# Patient Record
Sex: Male | Born: 1970 | Race: White | Hispanic: No | Marital: Married | Smoking: Current every day smoker
Health system: Southern US, Community
[De-identification: ages and names within clinical notes are randomized; demographics above are authoritative.]

## PROBLEM LIST (undated history)

## (undated) DIAGNOSIS — T7840XA Allergy, unspecified, initial encounter: Secondary | ICD-10-CM

## (undated) DIAGNOSIS — I10 Essential (primary) hypertension: Secondary | ICD-10-CM

## (undated) DIAGNOSIS — R011 Cardiac murmur, unspecified: Secondary | ICD-10-CM

## (undated) HISTORY — DX: Allergy, unspecified, initial encounter: T78.40XA

## (undated) HISTORY — DX: Essential (primary) hypertension: I10

## (undated) HISTORY — DX: Cardiac murmur, unspecified: R01.1

---

## 1998-03-14 HISTORY — PX: OTHER SURGICAL HISTORY: SHX169

## 2012-09-25 LAB — LIPID PANEL
CHOLESTEROL: 219 mg/dL — AB (ref 0–200)
HDL: 38 mg/dL (ref 35–70)
LDL Cholesterol: 140 mg/dL
TRIGLYCERIDES: 204 mg/dL — AB (ref 40–160)

## 2013-12-04 ENCOUNTER — Ambulatory Visit: Payer: Self-pay | Admitting: Internal Medicine

## 2013-12-04 LAB — BASIC METABOLIC PANEL WITH GFR
BUN: 14 mg/dL (ref 4–21)
Creatinine: 1 mg/dL (ref <=1.3)

## 2013-12-04 LAB — CBC AND DIFFERENTIAL: Hemoglobin: 15.7 g/dL (ref 13.5–17.5)

## 2013-12-04 LAB — TSH: TSH: 1.26 u[IU]/mL (ref <=5.90)

## 2015-01-24 ENCOUNTER — Encounter: Payer: Self-pay | Admitting: Internal Medicine

## 2015-01-28 ENCOUNTER — Other Ambulatory Visit: Payer: Self-pay | Admitting: Internal Medicine

## 2015-01-28 DIAGNOSIS — I10 Essential (primary) hypertension: Secondary | ICD-10-CM | POA: Insufficient documentation

## 2015-01-28 DIAGNOSIS — F17200 Nicotine dependence, unspecified, uncomplicated: Secondary | ICD-10-CM | POA: Insufficient documentation

## 2015-01-28 DIAGNOSIS — E785 Hyperlipidemia, unspecified: Secondary | ICD-10-CM | POA: Insufficient documentation

## 2015-01-28 DIAGNOSIS — R0683 Snoring: Secondary | ICD-10-CM | POA: Insufficient documentation

## 2015-01-28 DIAGNOSIS — R519 Headache, unspecified: Secondary | ICD-10-CM | POA: Insufficient documentation

## 2015-01-28 DIAGNOSIS — E782 Mixed hyperlipidemia: Secondary | ICD-10-CM | POA: Insufficient documentation

## 2015-01-28 DIAGNOSIS — G8929 Other chronic pain: Secondary | ICD-10-CM | POA: Insufficient documentation

## 2015-01-28 DIAGNOSIS — R51 Headache: Secondary | ICD-10-CM

## 2015-07-01 ENCOUNTER — Ambulatory Visit: Payer: Self-pay | Admitting: Internal Medicine

## 2015-07-03 ENCOUNTER — Encounter: Payer: Self-pay | Admitting: Internal Medicine

## 2015-07-03 ENCOUNTER — Ambulatory Visit (INDEPENDENT_AMBULATORY_CARE_PROVIDER_SITE_OTHER): Payer: Self-pay | Admitting: Internal Medicine

## 2015-07-03 VITALS — BP 133/76 | HR 80 | Ht 72.0 in | Wt 227.0 lb

## 2015-07-03 DIAGNOSIS — F419 Anxiety disorder, unspecified: Secondary | ICD-10-CM | POA: Insufficient documentation

## 2015-07-03 DIAGNOSIS — R229 Localized swelling, mass and lump, unspecified: Secondary | ICD-10-CM

## 2015-07-03 DIAGNOSIS — R220 Localized swelling, mass and lump, head: Secondary | ICD-10-CM

## 2015-07-03 DIAGNOSIS — I10 Essential (primary) hypertension: Secondary | ICD-10-CM

## 2015-07-03 DIAGNOSIS — F411 Generalized anxiety disorder: Secondary | ICD-10-CM | POA: Insufficient documentation

## 2015-07-03 MED ORDER — ALPRAZOLAM 0.5 MG PO TABS: 0.5000 mg | ORAL_TABLET | Freq: Every evening | ORAL | Status: DC | PRN

## 2015-07-03 MED ORDER — HYDROCHLOROTHIAZIDE 25 MG PO TABS: 25.0000 mg | ORAL_TABLET | Freq: Every day | ORAL | Status: DC

## 2015-07-03 MED ORDER — AMLODIPINE BESYLATE 5 MG PO TABS: 5.0000 mg | ORAL_TABLET | Freq: Every day | ORAL | Status: DC

## 2015-07-03 MED ORDER — BUPROPION HCL ER (XL) 150 MG PO TB24: 150.0000 mg | ORAL_TABLET | Freq: Every day | ORAL | Status: DC

## 2015-07-03 NOTE — Progress Notes (Signed)
Date:  07/03/2015   Name:  Maurice Hill   DOB:  27-Aug-1970   MRN:  [id number]   Chief Complaint: Follow-up; Anxiety; and Hypertension Anxiety Presents for initial visit. The problem has been gradually worsening. Symptoms include irritability, nervous/anxious behavior and restlessness. Patient reports no chest pain, decreased concentration, dizziness, excessive worry, palpitations, shortness of breath or suicidal ideas. Symptoms occur most days. The severity of symptoms is causing significant distress. 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   Past treatments include SSRIs (he has taken celexa in the past and it removed all emotion.  He is very hesitant to try again.Marland Kitchen.  He took several of his wife's xanax and it help significantly to calm him down.).  Hypertension This is a chronic problem. The current episode started more than 1 year ago. The problem is unchanged. The problem is controlled. Associated symptoms include anxiety. Pertinent negatives include no chest pain, palpitations or shortness of breath.   Erectile dysfunction - he had normal libido but decreased strength of erections.  He is able to achieve intercourse.  He has never tried Viagra and can not afford it.  I do not believe that he needs any intervention at this time.  Issues may also be tied to recent anxiety and work stressors.  Lesion - behind left ear - cystic lesion that began several years ago.  Initially small, now larger.  It has not drained.  Recently it seemed to get bigger but now in decreasing in size.  It is not tender. Review of Systems  Constitutional: Positive for irritability. Negative for fever, chills and fatigue.  Respiratory: Negative for choking, chest tightness, shortness of breath and wheezing.   Cardiovascular: Negative for chest pain, palpitations and leg swelling.  Gastrointestinal: Negative for  abdominal pain.  Genitourinary:       Mild decrease in the strength of his erections.   Musculoskeletal: Negative for joint swelling and arthralgias.  Skin: Positive for wound. Negative for color change.  Neurological: Negative for dizziness and numbness.  Psychiatric/Behavioral: Positive for sleep disturbance and agitation. Negative for suicidal ideas, self-injury, dysphoric mood and decreased concentration. The patient is nervous/anxious.     Patient Active Problem List   Diagnosis Date Noted  . Essential (primary) hypertension 01/28/2015  . Chronic headache 01/28/2015  . HLD (hyperlipidemia) 01/28/2015  . Snores 01/28/2015  . Compulsive tobacco user syndrome 01/28/2015    Prior to Admission medications   Medication Sig Start Date End Date Taking? Authorizing Provider  amLODipine (NORVASC) 5 MG tablet Take 1 tablet by mouth daily. 07/03/14  Yes Historical Provider, MD  hydrochlorothiazide (HYDRODIURIL) 25 MG tablet Take 1 tablet by mouth daily. 07/03/14  Yes Historical Provider, MD    Allergies  Allergen Reactions  . Penicillins Rash    Past Surgical History  Procedure Laterality Date  . Testicular cyst  2000    Social History  Substance Use Topics  . Smoking status: Current Every Day Smoker  . Smokeless tobacco: None  . Alcohol Use: 0.0 oz/week    0 Standard drinks or equivalent per week     Comment: occasional     Medication list has been reviewed and updated.   Physical Exam  Constitutional: He is oriented to person, place, and time. He appears well-developed and well-nourished. No distress.  HENT:  Head: Normocephalic and atraumatic.  Neck: Normal range of motion. Neck supple. No thyromegaly present.  Cardiovascular: Normal rate, regular rhythm and normal heart sounds.   Pulmonary/Chest: Effort normal and breath sounds normal. No respiratory distress.  Musculoskeletal: He exhibits no edema or tenderness.  Lymphadenopathy:    He has no cervical adenopathy.    Neurological: He is alert and oriented to person, place, and time.  Skin: Skin is warm and dry. No rash noted.  Behind left ear - 1x1 cm very soft cystic structure - not tender or inflamed.  Psychiatric: His speech is normal and behavior is normal. Thought content normal. His mood appears anxious.    BP 140/82 mmHg  Pulse 80  Ht 6' (1.829 m)  Wt 227 lb (102.967 kg)  BMI 30.78 kg/m2  Assessment and Plan: 1. Essential (primary) hypertension Controlled - continue current therapy - hydrochlorothiazide (HYDRODIURIL) 25 MG tablet; Take 1 tablet (25 mg total) by mouth daily.  Dispense: 90 tablet; Refill: 3 - amLODipine (NORVASC) 5 MG tablet; Take 1 tablet (5 mg total) by mouth daily.  Dispense: 90 tablet; Refill: 3 - Basic metabolic panel  2. Generalized anxiety disorder Begin Wellbutrin - call for refill if helpful Pt instructed to use Xanax no more than twice per week Re-enforced regular exercise as a beneficial activity. - buPROPion (WELLBUTRIN XL) 150 MG 24 hr tablet; Take 1 tablet (150 mg total) by mouth daily.  Dispense: 30 tablet; Refill: 3 - ALPRAZolam (XANAX) 0.5 MG tablet; Take 1 tablet (0.5 mg total) by mouth at bedtime as needed for anxiety.  Dispense: 20 tablet; Refill: 0  3. Mass of scalp Recommend Dermatology evaluation   Bari Edward, MD [facility]  07/03/2015

## 2015-07-03 NOTE — Patient Instructions (Signed)
DASH Eating Plan  DASH stands for "Dietary Approaches to Stop Hypertension." The DASH eating plan is a healthy eating plan that has been shown to reduce high blood pressure (hypertension). Additional health benefits may include reducing the risk of type 2 diabetes mellitus, heart disease, and stroke. The DASH eating plan may also help with weight loss.  WHAT DO I NEED TO KNOW ABOUT THE DASH EATING PLAN?  For the DASH eating plan, you will follow these general guidelines:  · Choose foods with a percent daily value for sodium of less than 5% (as listed on the food label).  · Use salt-free seasonings or herbs instead of table salt or sea salt.  · Check with your health care provider or pharmacist before using salt substitutes.  · Eat lower-sodium products, often labeled as "lower sodium" or "no salt added."  · Eat fresh foods.  · Eat more vegetables, fruits, and low-fat dairy products.  · Choose whole grains. Look for the word "whole" as the first word in the ingredient list.  · Choose fish and skinless chicken or turkey more often than red meat. Limit fish, poultry, and meat to 6 oz (170 g) each day.  · Limit sweets, desserts, sugars, and sugary drinks.  · Choose heart-healthy fats.  · Limit cheese to 1 oz (28 g) per day.  · Eat more home-cooked food and less restaurant, buffet, and fast food.  · Limit fried foods.  · Cook foods using methods other than frying.  · Limit canned vegetables. If you do use them, rinse them well to decrease the sodium.  · When eating at a restaurant, ask that your food be prepared with less salt, or no salt if possible.  WHAT FOODS CAN I EAT?  Seek help from a dietitian for individual calorie needs.  Grains  Whole grain or whole wheat bread. Brown rice. Whole grain or whole wheat pasta. Quinoa, bulgur, and whole grain cereals. Low-sodium cereals. Corn or whole wheat flour tortillas. Whole grain cornbread. Whole grain crackers. Low-sodium crackers.  Vegetables  Fresh or frozen vegetables  (raw, steamed, roasted, or grilled). Low-sodium or reduced-sodium tomato and vegetable juices. Low-sodium or reduced-sodium tomato sauce and paste. Low-sodium or reduced-sodium canned vegetables.   Fruits  All fresh, canned (in natural juice), or frozen fruits.  Meat and Other Protein Products  Ground beef (85% or leaner), grass-fed beef, or beef trimmed of fat. Skinless chicken or turkey. Ground chicken or turkey. Pork trimmed of fat. All fish and seafood. Eggs. Dried beans, peas, or lentils. Unsalted nuts and seeds. Unsalted canned beans.  Dairy  Low-fat dairy products, such as skim or 1% milk, 2% or reduced-fat cheeses, low-fat ricotta or cottage cheese, or plain low-fat yogurt. Low-sodium or reduced-sodium cheeses.  Fats and Oils  Tub margarines without trans fats. Light or reduced-fat mayonnaise and salad dressings (reduced sodium). Avocado. Safflower, olive, or canola oils. Natural peanut or almond butter.  Other  Unsalted popcorn and pretzels.  The items listed above may not be a complete list of recommended foods or beverages. Contact your dietitian for more options.  WHAT FOODS ARE NOT RECOMMENDED?  Grains  White bread. White pasta. White rice. Refined cornbread. Bagels and croissants. Crackers that contain trans fat.  Vegetables  Creamed or fried vegetables. Vegetables in a cheese sauce. Regular canned vegetables. Regular canned tomato sauce and paste. Regular tomato and vegetable juices.  Fruits  Dried fruits. Canned fruit in light or heavy syrup. Fruit juice.  Meat and Other Protein   Products  Fatty cuts of meat. Ribs, chicken wings, bacon, sausage, bologna, salami, chitterlings, fatback, hot dogs, bratwurst, and packaged luncheon meats. Salted nuts and seeds. Canned beans with salt.  Dairy  Whole or 2% milk, cream, half-and-half, and cream cheese. Whole-fat or sweetened yogurt. Full-fat cheeses or blue cheese. Nondairy creamers and whipped toppings. Processed cheese, cheese spreads, or cheese  curds.  Condiments  Onion and garlic salt, seasoned salt, table salt, and sea salt. Canned and packaged gravies. Worcestershire sauce. Tartar sauce. Barbecue sauce. Teriyaki sauce. Soy sauce, including reduced sodium. Steak sauce. Fish sauce. Oyster sauce. Cocktail sauce. Horseradish. Ketchup and mustard. Meat flavorings and tenderizers. Bouillon cubes. Hot sauce. Tabasco sauce. Marinades. Taco seasonings. Relishes.  Fats and Oils  Butter, stick margarine, lard, shortening, ghee, and bacon fat. Coconut, palm kernel, or palm oils. Regular salad dressings.  Other  Pickles and olives. Salted popcorn and pretzels.  The items listed above may not be a complete list of foods and beverages to avoid. Contact your dietitian for more information.  WHERE CAN I FIND MORE INFORMATION?  National Heart, Lung, and [employer] Institute: www.nhlbi.nih.gov/health/health-topics/topics/dash/     This information is not intended to replace advice given to you by your health care provider. Make sure you discuss any questions you have with your health care provider.     Document Released: 02/17/2011 Document Revised: 03/21/2014 Document Reviewed: 01/02/2013  Elsevier Interactive Patient Education ©2016 Elsevier Inc.

## 2015-07-04 LAB — BASIC METABOLIC PANEL
BUN/Creatinine Ratio: 18 (ref 9–20)
BUN: 15 mg/dL (ref 6–24)
CALCIUM: 9.9 mg/dL (ref 8.7–10.2)
CO2: 25 mmol/L (ref 18–29)
CREATININE: 0.82 mg/dL (ref 0.76–1.27)
Chloride: 97 mmol/L (ref 96–106)
GFR calc Af Amer: 124 mL/min/{1.73_m2} (ref >59)
GFR, EST NON AFRICAN AMERICAN: 108 mL/min/{1.73_m2} (ref >59)
Glucose: 93 mg/dL (ref 65–99)
Potassium: 3.9 mmol/L (ref 3.5–5.2)
SODIUM: 142 mmol/L (ref 134–144)

## 2015-07-06 ENCOUNTER — Telehealth: Payer: Self-pay

## 2015-07-06 NOTE — Telephone Encounter (Signed)
Spoke with patient. Patient advised of all results and verbalized understanding. Will call back with any future questions or concerns. MA[facility]  

## 2015-07-06 NOTE — Telephone Encounter (Signed)
-----   Message from Reubin MilanLaura H Berglund, MD sent at 07/05/2015  8:14 AM EDT ----- Blood glucose and kidney function are normal.

## 2015-07-06 NOTE — Telephone Encounter (Signed)
Left message for patient to call back  

## 2015-09-02 ENCOUNTER — Ambulatory Visit (INDEPENDENT_AMBULATORY_CARE_PROVIDER_SITE_OTHER): Payer: Self-pay | Admitting: Internal Medicine

## 2015-09-02 ENCOUNTER — Encounter: Payer: Self-pay | Admitting: Internal Medicine

## 2015-09-02 VITALS — BP 121/84 | HR 84 | Resp 16 | Ht 72.0 in | Wt 219.8 lb

## 2015-09-02 DIAGNOSIS — F17200 Nicotine dependence, unspecified, uncomplicated: Secondary | ICD-10-CM

## 2015-09-02 DIAGNOSIS — Z720 Tobacco use: Secondary | ICD-10-CM

## 2015-09-02 DIAGNOSIS — M722 Plantar fascial fibromatosis: Secondary | ICD-10-CM

## 2015-09-02 DIAGNOSIS — F411 Generalized anxiety disorder: Secondary | ICD-10-CM

## 2015-09-02 DIAGNOSIS — I10 Essential (primary) hypertension: Secondary | ICD-10-CM

## 2015-09-02 HISTORY — DX: Plantar fascial fibromatosis: M72.2

## 2015-09-02 MED ORDER — ALPRAZOLAM 0.5 MG PO TABS: 0.2500 mg | ORAL_TABLET | Freq: Two times a day (BID) | ORAL | Status: DC | PRN

## 2015-09-02 NOTE — Progress Notes (Signed)
Date:  09/02/2015   Name:  Maurice Hill   DOB:  01-26-1971   MRN:  [id number]   Chief Complaint: Hypertension and Plantar Fasciitis Hypertension This is a chronic problem. The current episode started more than 1 year ago. The problem is controlled. Associated symptoms include anxiety. Pertinent negatives include no chest pain, headaches or palpitations. Past treatments include calcium channel blockers. The current treatment provides significant improvement.  Anxiety Presents for follow-up visit. The problem has been waxing and waning. Symptoms include excessive worry, irritability, nervous/anxious behavior and restlessness. Patient reports no chest pain or palpitations. Episode frequency: 3-4 days per week. The symptoms are aggravated by work stress. The quality of sleep is good.   Past treatments include benzodiazephines, non-benzodiazephine anxiolytics and SSRIs (wellbutrin made him very angry.  He did not tolerate SSRIs.).  Foot Injury   Patient noted pain in his left heel about 2 months ago. There wasn't any discrete injury or change in activity. He's been doing some stretches for what he thinks is plantar fasciitis and that has helped. He was concerned that it might also be gout and so he has cut back on his intake of pork and red meat. He has not tried ice or anti-inflammatories.    Review of Systems  Constitutional: Positive for irritability. Negative for fever, chills and fatigue.  Respiratory: Negative for cough and chest tightness.   Cardiovascular: Negative for chest pain, palpitations and leg swelling.  Neurological: Negative for tremors, seizures and headaches.  Psychiatric/Behavioral: Positive for agitation. Negative for hallucinations, sleep disturbance and dysphoric mood. The patient is nervous/anxious.     Patient Active Problem List   Diagnosis Date Noted  . Anxiety disorder 07/03/2015  . Essential (primary) hypertension 01/28/2015  . Chronic headache  01/28/2015  . HLD (hyperlipidemia) 01/28/2015  . Snores 01/28/2015  . Compulsive tobacco user syndrome 01/28/2015    Prior to Admission medications   Medication Sig Start Date End Date Taking? Authorizing Provider  ALPRAZolam Prudy Feeler(XANAX) 0.5 MG tablet Take 1 tablet (0.5 mg total) by mouth at bedtime as needed for anxiety. 07/03/15  Yes Reubin MilanLaura H Mataio Mele, MD  amLODipine (NORVASC) 5 MG tablet Take 1 tablet (5 mg total) by mouth daily. 07/03/15  Yes Reubin MilanLaura H Velina Drollinger, MD  buPROPion (WELLBUTRIN XL) 150 MG 24 hr tablet Take 1 tablet (150 mg total) by mouth daily. 07/03/15  Yes Reubin MilanLaura H Shayli Altemose, MD  hydrochlorothiazide (HYDRODIURIL) 25 MG tablet Take 1 tablet (25 mg total) by mouth daily. 07/03/15  Yes Reubin MilanLaura H Chiyoko Torrico, MD    Allergies  Allergen Reactions  . Penicillins Rash    Past Surgical History  Procedure Laterality Date  . Testicular cyst  2000    Social History  Substance Use Topics  . Smoking status: Current Every Day Smoker  . Smokeless tobacco: None  . Alcohol Use: 0.0 oz/week    0 Standard drinks or equivalent per week     Comment: occasional     Medication list has been reviewed and updated.   Physical Exam  Constitutional: He is oriented to person, place, and time. He appears well-developed. No distress.  HENT:  Head: Normocephalic and atraumatic.  Neck: Normal range of motion. Neck supple. No thyromegaly present.  Cardiovascular: Normal rate, regular rhythm and normal heart sounds.   Pulmonary/Chest: Effort normal and breath sounds normal. No respiratory distress. He has no wheezes. He has no rales.  Musculoskeletal: Normal range of motion. He exhibits tenderness. He exhibits no edema.  Tender over  left plantar heel -  Neurological: He is alert and oriented to person, place, and time. He has normal reflexes.  Skin: Skin is warm and dry. No rash noted.  Psychiatric: He has a normal mood and affect. His behavior is normal. Thought content normal.  Nursing note and vitals  reviewed.   BP 121/84 mmHg  Pulse 84  Resp 16  Ht 6' (1.829 m)  Wt 219 lb 12.8 oz (99.701 kg)  BMI 29.80 kg/m2  SpO2 98%  Assessment and Plan: 1. Smoker - Nurse to provide smoking / tobacco cessation education  2. Essential (primary) hypertension Controlled - continue current therapy  3. Generalized anxiety disorder Intolerant of SSRI and Bupropion - ALPRAZolam (XANAX) 0.5 MG tablet; Take 0.5 tablets (0.25 mg total) by mouth 2 (two) times daily as needed for anxiety.  Dispense: 30 tablet; Refill: 5  4. Plantar fasciitis of left foot Continue stretches Use Ice and NSAIDS if needed   Bari Edward, MD [facility]  09/02/2015

## 2015-09-02 NOTE — Patient Instructions (Signed)
Plantar Fasciitis Plantar fasciitis is a painful foot condition that affects the heel. It occurs when the band of tissue that connects the toes to the heel bone (plantar fascia) becomes irritated. This can happen after exercising too much or doing other repetitive activities (overuse injury). The pain from plantar fasciitis can range from mild irritation to severe pain that makes it difficult for you to walk or move. The pain is usually worse in the morning or after you have been sitting or lying down for a while. CAUSES This condition may be caused by:  Standing for long periods of time.  Wearing shoes that do not fit.  Doing high-impact activities, including running, aerobics, and ballet.  Being overweight.  Having an abnormal way of walking (gait).  Having tight calf muscles.  Having high arches in your feet.  Starting a new athletic activity. SYMPTOMS The main symptom of this condition is heel pain. Other symptoms include:  Pain that gets worse after activity or exercise.  Pain that is worse in the morning or after resting.  Pain that goes away after you walk for a few minutes. DIAGNOSIS This condition may be diagnosed based on your signs and symptoms. Your health care provider will also do a physical exam to check for:  A tender area on the bottom of your foot.  A high arch in your foot.  Pain when you move your foot.  Difficulty moving your foot. You may also need to have imaging studies to confirm the diagnosis. These can include:  X-rays.  Ultrasound.  MRI. TREATMENT  Treatment for plantar fasciitis depends on the severity of the condition. Your treatment may include:  Rest, ice, and over-the-counter pain medicines to manage your pain.  Exercises to stretch your calves and your plantar fascia.  A splint that holds your foot in a stretched, upward position while you sleep (night splint).  Physical therapy to relieve symptoms and prevent problems in the  future.  Cortisone injections to relieve severe pain.  Extracorporeal shock wave therapy (ESWT) to stimulate damaged plantar fascia with electrical impulses. It is often used as a last resort before surgery.  Surgery, if other treatments have not worked after 12 months. HOME CARE INSTRUCTIONS  Take medicines only as directed by your health care provider.  Avoid activities that cause pain.  Roll the bottom of your foot over a bag of ice or a bottle of cold water. Do this for 20 minutes, 3-4 times a day.  Perform simple stretches as directed by your health care provider.  Try wearing athletic shoes with air-sole or gel-sole cushions or soft shoe inserts.  Wear a night splint while sleeping, if directed by your health care provider.  Keep all follow-up appointments with your health care provider. PREVENTION   Do not perform exercises or activities that cause heel pain.  Consider finding low-impact activities if you continue to have problems.  Lose weight if you need to. The best way to prevent plantar fasciitis is to avoid the activities that aggravate your plantar fascia. SEEK MEDICAL CARE IF:  Your symptoms do not go away after treatment with home care measures.  Your pain gets worse.  Your pain affects your ability to move or do your daily activities.   This information is not intended to replace advice given to you by your health care provider. Make sure you discuss any questions you have with your health care provider.   Document Released: 11/23/2000 Document Revised: 11/19/2014 Document Reviewed: 01/08/2014 Elsevier   Interactive Patient Education 2016 Elsevier Inc.  

## 2016-02-23 IMAGING — CR DG CHEST 2V
2 series · 2 of 2 positions shown · non-contrast
Comparison: None.

CLINICAL DATA: Fatigue

EXAM:
CHEST  2 VIEW

[chest pa]
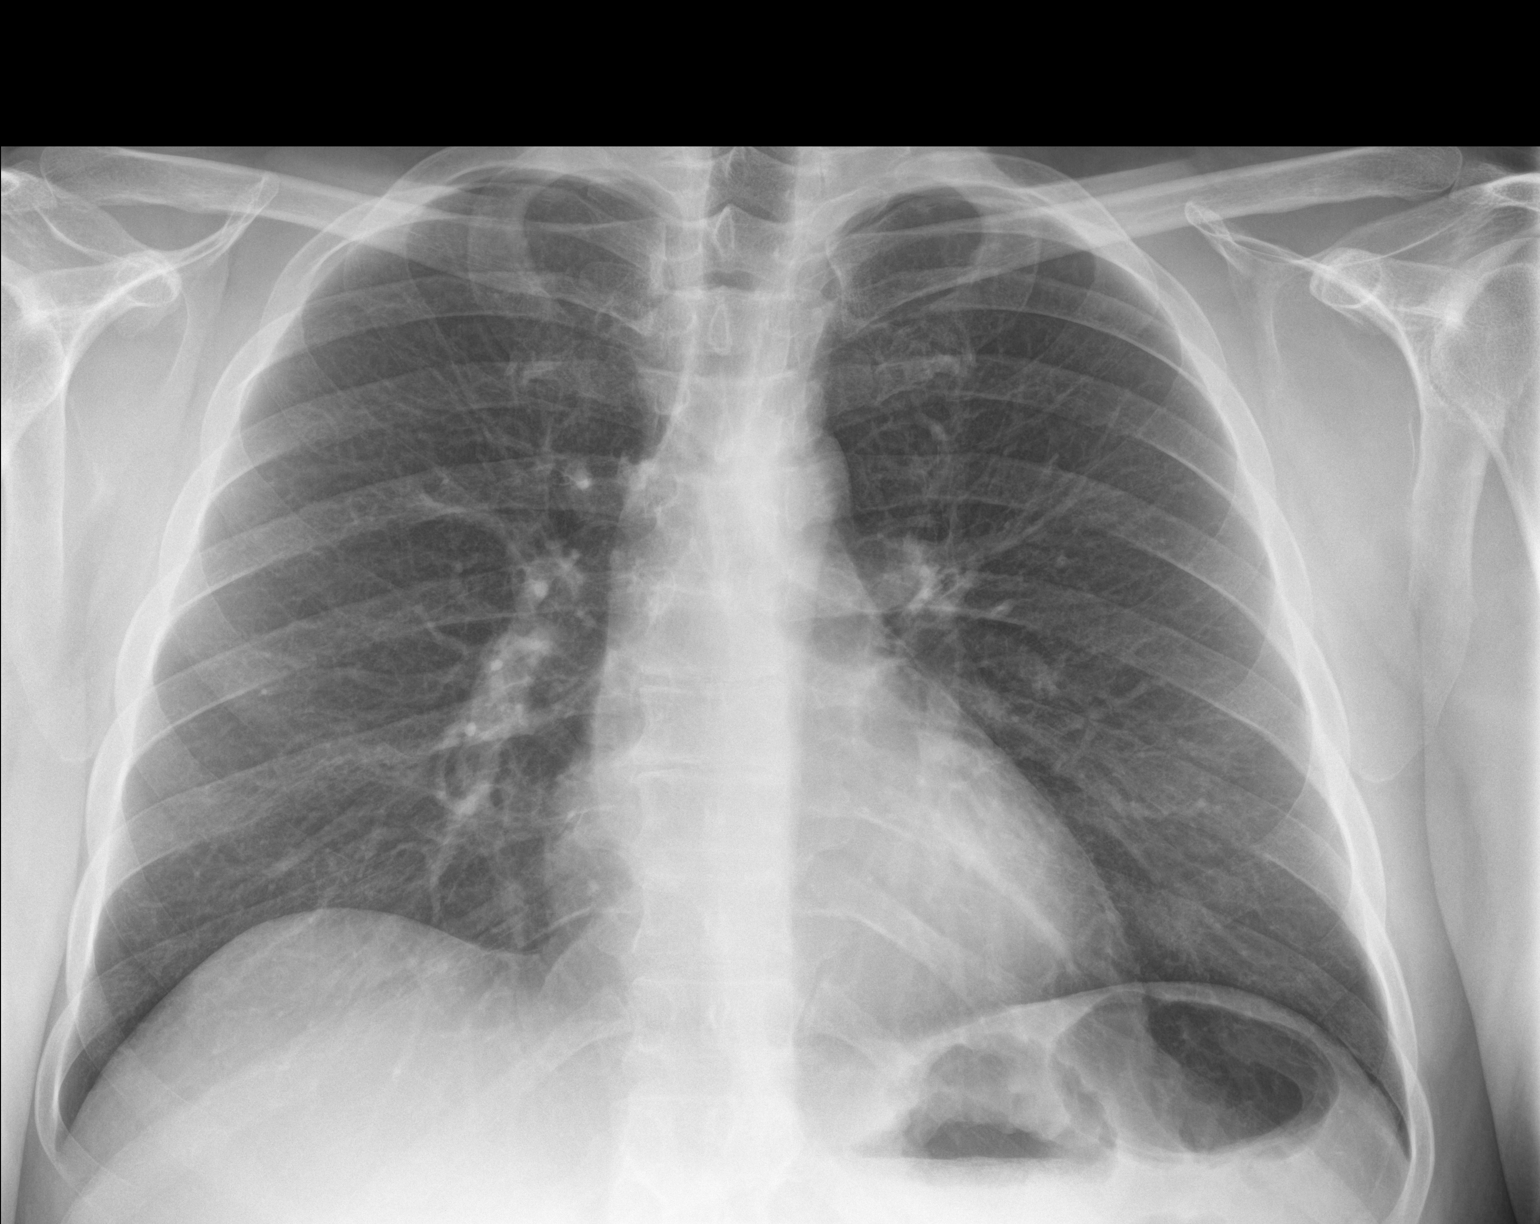

[chest lat]
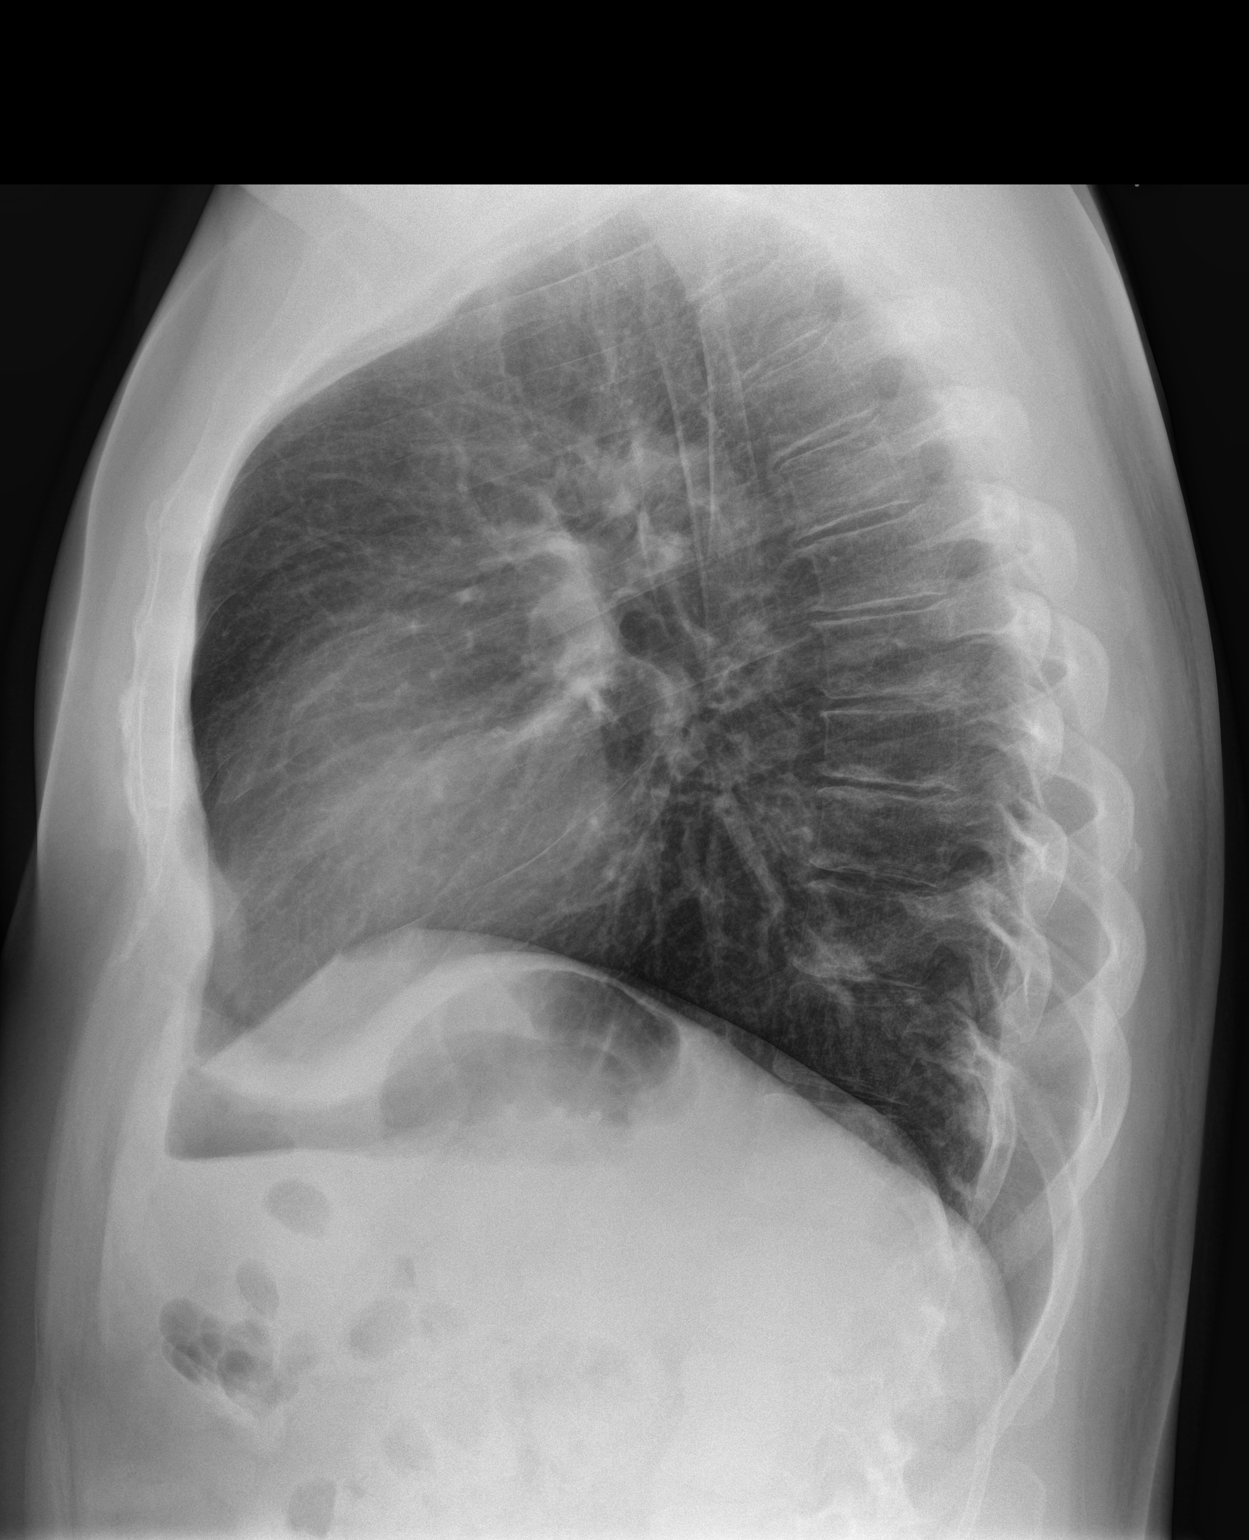

[2 of 2 positions shown; findings below may reference images not displayed]

FINDINGS: The heart size and mediastinal contours are within normal limits.
Both lungs are clear. The visualized skeletal structures are
unremarkable.
IMPRESSION: No active cardiopulmonary disease.

## 2016-03-03 ENCOUNTER — Ambulatory Visit: Payer: Self-pay | Admitting: Internal Medicine

## 2016-03-10 ENCOUNTER — Ambulatory Visit (INDEPENDENT_AMBULATORY_CARE_PROVIDER_SITE_OTHER): Payer: Self-pay | Admitting: Internal Medicine

## 2016-03-10 ENCOUNTER — Encounter: Payer: Self-pay | Admitting: Internal Medicine

## 2016-03-10 VITALS — BP 130/80 | HR 82 | Temp 97.6°F | Ht 71.0 in | Wt 228.0 lb

## 2016-03-10 DIAGNOSIS — F17200 Nicotine dependence, unspecified, uncomplicated: Secondary | ICD-10-CM

## 2016-03-10 DIAGNOSIS — I10 Essential (primary) hypertension: Secondary | ICD-10-CM

## 2016-03-10 DIAGNOSIS — F411 Generalized anxiety disorder: Secondary | ICD-10-CM

## 2016-03-10 MED ORDER — ALPRAZOLAM 0.5 MG PO TABS: 0.2500 mg | ORAL_TABLET | Freq: Two times a day (BID) | ORAL | 5 refills | Status: DC | PRN

## 2016-03-10 NOTE — Progress Notes (Signed)
Date:  03/10/2016   Name:  Maurice Hill   DOB:  01-Dec-1970   MRN:  [id number]   Chief Complaint: Anxiety Anxiety  Presents for follow-up visit. Symptoms include excessive worry, irritability and nervous/anxious behavior. Patient reports no chest pain, depressed mood, palpitations, shortness of breath or suicidal ideas. Symptoms occur most days. The severity of symptoms is moderate. The quality of sleep is good.    Hypertension  This is a chronic problem. The problem is unchanged. The problem is controlled. Associated symptoms include anxiety. Pertinent negatives include no chest pain, headaches, palpitations or shortness of breath. Past treatments include calcium channel blockers and diuretics. The current treatment provides significant improvement.   Tobacco use - continues to smoke 1.5 ppd.  He is not interested in quitting at this time.  Other than brief morning cough, he denies chest symptoms.  He declines flu vaccine.   Review of Systems  Constitutional: Positive for irritability. Negative for appetite change, fatigue and unexpected weight change.  Eyes: Negative for visual disturbance.  Respiratory: Positive for cough (am cough due to smoking). Negative for shortness of breath and wheezing.   Cardiovascular: Negative for chest pain, palpitations and leg swelling.  Gastrointestinal: Negative for abdominal pain and blood in stool.  Endocrine: Negative for polydipsia and polyuria.  Genitourinary: Negative for dysuria and hematuria.  Musculoskeletal: Negative for arthralgias.  Skin: Negative for color change and rash.  Neurological: Negative for tremors, numbness and headaches.  Psychiatric/Behavioral: Negative for dysphoric mood, sleep disturbance and suicidal ideas. The patient is nervous/anxious.     Patient Active Problem List   Diagnosis Date Noted  . Plantar fasciitis of left foot 09/02/2015  . Anxiety disorder 07/03/2015  . Essential (primary) hypertension  01/28/2015  . Chronic headache 01/28/2015  . HLD (hyperlipidemia) 01/28/2015  . Snores 01/28/2015  . Compulsive tobacco user syndrome 01/28/2015    Prior to Admission medications   Medication Sig Start Date End Date Taking? Authorizing Provider  ALPRAZolam Prudy Feeler(XANAX) 0.5 MG tablet Take 0.5 tablets (0.25 mg total) by mouth 2 (two) times daily as needed for anxiety. 09/02/15  Yes Reubin MilanLaura H Berglund, MD  amLODipine (NORVASC) 5 MG tablet Take 1 tablet (5 mg total) by mouth daily. 07/03/15  Yes Reubin MilanLaura H Berglund, MD  hydrochlorothiazide (HYDRODIURIL) 25 MG tablet Take 1 tablet (25 mg total) by mouth daily. 07/03/15  Yes Reubin MilanLaura H Berglund, MD    Allergies  Allergen Reactions  . Penicillins Rash    Past Surgical History:  Procedure Laterality Date  . testicular cyst  2000    Social History  Substance Use Topics  . Smoking status: Current Every Day Smoker  . Smokeless tobacco: Current User  . Alcohol use 0.0 oz/week     Comment: occasional     Medication list has been reviewed and updated.   Physical Exam  Constitutional: He is oriented to person, place, and time. He appears well-developed. No distress.  HENT:  Head: Normocephalic and atraumatic.  Neck: Normal range of motion. Carotid bruit is not present.  Cardiovascular: Normal rate, regular rhythm and normal heart sounds.   Pulmonary/Chest: Effort normal and breath sounds normal. No respiratory distress. He has no wheezes.  Musculoskeletal: Normal range of motion. He exhibits no edema.  Neurological: He is alert and oriented to person, place, and time.  Skin: Skin is warm and dry. No rash noted.  Psychiatric: He has a normal mood and affect. His behavior is normal. Thought content normal.  Nursing note and  vitals reviewed.   BP 130/80   Pulse 82   Temp 97.6 F (36.4 C)   Ht 5\' 11"  (1.803 m)   Wt 228 lb (103.4 kg)   SpO2 98%   BMI 31.80 kg/m   Assessment and Plan: 1. Essential (primary) hypertension Controlled Normal  BMP in April  2. Generalized anxiety disorder stable - ALPRAZolam (XANAX) 0.5 MG tablet; Take 0.5 tablets (0.25 mg total) by mouth 2 (two) times daily as needed for anxiety.  Dispense: 30 tablet; Refill: 5  3. Compulsive tobacco user syndrome Encouraged to consider efforts to quit - not motivated at this time   Bari EdwardLaura Berglund, MD [facility]  03/10/2016

## 2016-03-10 NOTE — Patient Instructions (Signed)
DASH Eating Plan DASH stands for "Dietary Approaches to Stop Hypertension." The DASH eating plan is a healthy eating plan that has been shown to reduce high blood pressure (hypertension). Additional health benefits may include reducing the risk of type 2 diabetes mellitus, heart disease, and stroke. The DASH eating plan may also help with weight loss. What do I need to know about the DASH eating plan? For the DASH eating plan, you will follow these general guidelines:  Choose foods with less than 150 milligrams of sodium per serving (as listed on the food label).  Use salt-free seasonings or herbs instead of table salt or sea salt.  Check with your health care provider or pharmacist before using salt substitutes.  Eat lower-sodium products. These are often labeled as "low-sodium" or "no salt added."  Eat fresh foods. Avoid eating a lot of canned foods.  Eat more vegetables, fruits, and low-fat dairy products.  Choose whole grains. Look for the word "whole" as the first word in the ingredient list.  Choose fish and skinless chicken or turkey more often than red meat. Limit fish, poultry, and meat to 6 oz (170 g) each day.  Limit sweets, desserts, sugars, and sugary drinks.  Choose heart-healthy fats.  Eat more home-cooked food and less restaurant, buffet, and fast food.  Limit fried foods.  Do not fry foods. Cook foods using methods such as baking, boiling, grilling, and broiling instead.  When eating at a restaurant, ask that your food be prepared with less salt, or no salt if possible. What foods can I eat? Seek help from a dietitian for individual calorie needs. Grains  Whole grain or whole wheat bread. Brown rice. Whole grain or whole wheat pasta. Quinoa, bulgur, and whole grain cereals. Low-sodium cereals. Corn or whole wheat flour tortillas. Whole grain cornbread. Whole grain crackers. Low-sodium crackers. Vegetables  Fresh or frozen vegetables (raw, steamed, roasted, or  grilled). Low-sodium or reduced-sodium tomato and vegetable juices. Low-sodium or reduced-sodium tomato sauce and paste. Low-sodium or reduced-sodium canned vegetables. Fruits  All fresh, canned (in natural juice), or frozen fruits. Meat and Other Protein Products  Ground beef (85% or leaner), grass-fed beef, or beef trimmed of fat. Skinless chicken or turkey. Ground chicken or turkey. Pork trimmed of fat. All fish and seafood. Eggs. Dried beans, peas, or lentils. Unsalted nuts and seeds. Unsalted canned beans. Dairy  Low-fat dairy products, such as skim or 1% milk, 2% or reduced-fat cheeses, low-fat ricotta or cottage cheese, or plain low-fat yogurt. Low-sodium or reduced-sodium cheeses. Fats and Oils  Tub margarines without trans fats. Light or reduced-fat mayonnaise and salad dressings (reduced sodium). Avocado. Safflower, olive, or canola oils. Natural peanut or almond butter. Other  Unsalted popcorn and pretzels. The items listed above may not be a complete list of recommended foods or beverages. Contact your dietitian for more options.  What foods are not recommended? Grains  White bread. White pasta. White rice. Refined cornbread. Bagels and croissants. Crackers that contain trans fat. Vegetables  Creamed or fried vegetables. Vegetables in a cheese sauce. Regular canned vegetables. Regular canned tomato sauce and paste. Regular tomato and vegetable juices. Fruits  Canned fruit in light or heavy syrup. Fruit juice. Meat and Other Protein Products  Fatty cuts of meat. Ribs, chicken wings, bacon, sausage, bologna, salami, chitterlings, fatback, hot dogs, bratwurst, and packaged luncheon meats. Salted nuts and seeds. Canned beans with salt. Dairy  Whole or 2% milk, cream, half-and-half, and cream cheese. Whole-fat or sweetened yogurt. Full-fat cheeses   or blue cheese. Nondairy creamers and whipped toppings. Processed cheese, cheese spreads, or cheese curds. Condiments  Onion and garlic  salt, seasoned salt, table salt, and sea salt. Canned and packaged gravies. Worcestershire sauce. Tartar sauce. Barbecue sauce. Teriyaki sauce. Soy sauce, including reduced sodium. Steak sauce. Fish sauce. Oyster sauce. Cocktail sauce. Horseradish. Ketchup and mustard. Meat flavorings and tenderizers. Bouillon cubes. Hot sauce. Tabasco sauce. Marinades. Taco seasonings. Relishes. Fats and Oils  Butter, stick margarine, lard, shortening, ghee, and bacon fat. Coconut, palm kernel, or palm oils. Regular salad dressings. Other  Pickles and olives. Salted popcorn and pretzels. The items listed above may not be a complete list of foods and beverages to avoid. Contact your dietitian for more information.  Where can I find more information? National Heart, Lung, and Blood Institute: www.nhlbi.nih.gov/health/health-topics/topics/dash/ This information is not intended to replace advice given to you by your health care provider. Make sure you discuss any questions you have with your health care provider. Document Released: 02/17/2011 Document Revised: 08/06/2015 Document Reviewed: 01/02/2013 Elsevier Interactive Patient Education  2017 Elsevier Inc.  

## 2016-06-27 ENCOUNTER — Other Ambulatory Visit: Payer: Self-pay | Admitting: Internal Medicine

## 2016-06-27 DIAGNOSIS — I10 Essential (primary) hypertension: Secondary | ICD-10-CM

## 2016-07-11 ENCOUNTER — Other Ambulatory Visit: Payer: Self-pay | Admitting: Internal Medicine

## 2016-07-11 DIAGNOSIS — I10 Essential (primary) hypertension: Secondary | ICD-10-CM

## 2016-09-08 ENCOUNTER — Ambulatory Visit (INDEPENDENT_AMBULATORY_CARE_PROVIDER_SITE_OTHER): Payer: Self-pay | Admitting: Internal Medicine

## 2016-09-08 ENCOUNTER — Encounter: Payer: Self-pay | Admitting: Internal Medicine

## 2016-09-08 VITALS — BP 136/80 | HR 82 | Ht 71.0 in | Wt 212.0 lb

## 2016-09-08 DIAGNOSIS — F17200 Nicotine dependence, unspecified, uncomplicated: Secondary | ICD-10-CM

## 2016-09-08 DIAGNOSIS — F411 Generalized anxiety disorder: Secondary | ICD-10-CM

## 2016-09-08 DIAGNOSIS — I10 Essential (primary) hypertension: Secondary | ICD-10-CM

## 2016-09-08 MED ORDER — ALPRAZOLAM 0.5 MG PO TABS: 0.2500 mg | ORAL_TABLET | Freq: Two times a day (BID) | ORAL | 5 refills | Status: DC | PRN

## 2016-09-08 NOTE — Progress Notes (Signed)
Date:  09/08/2016   Name:  Maurice Hill   DOB:  [birth year]   MRN:  [id number]   Chief Complaint: Hypertension Hypertension  This is a chronic problem. The problem is controlled. Associated symptoms include anxiety and palpitations. Pertinent negatives include no chest pain, headaches or shortness of breath. Past treatments include diuretics and calcium channel blockers.  Anxiety  Presents for follow-up visit. Symptoms include nervous/anxious behavior and palpitations. Patient reports no chest pain or shortness of breath. Symptoms occur most days.        Review of Systems  Constitutional: Negative for appetite change, fatigue and unexpected weight change.  HENT: Negative for sore throat.   Eyes: Negative for visual disturbance.  Respiratory: Positive for wheezing. Negative for cough and shortness of breath.   Cardiovascular: Positive for palpitations. Negative for chest pain and leg swelling.  Gastrointestinal: Negative for abdominal pain and blood in stool.  Endocrine: Negative for polyuria.  Genitourinary: Negative for dysuria and hematuria.  Skin: Negative for color change and rash.  Neurological: Negative for tremors, numbness and headaches.  Psychiatric/Behavioral: Negative for dysphoric mood. The patient is nervous/anxious.     Patient Active Problem List   Diagnosis Date Noted  . Plantar fasciitis of left foot 09/02/2015  . Anxiety disorder 07/03/2015  . Essential (primary) hypertension 01/28/2015  . Chronic headache 01/28/2015  . HLD (hyperlipidemia) 01/28/2015  . Snores 01/28/2015  . Compulsive tobacco user syndrome 01/28/2015    Prior to Admission medications   Medication Sig Start Date End Date Taking? Authorizing Provider  ALPRAZolam Prudy Feeler) 0.5 MG tablet Take 0.5 tablets (0.25 mg total) by mouth 2 (two) times daily as needed for anxiety. 03/10/16  Yes Reubin Milan, MD  amLODipine (NORVASC) 5 MG tablet TAKE ONE TABLET BY MOUTH DAILY 07/12/16   Yes Reubin Milan, MD  hydrochlorothiazide (HYDRODIURIL) 25 MG tablet TAKE ONE TABLET BY MOUTH DAILY 06/28/16  Yes Reubin Milan, MD    Allergies  Allergen Reactions  . Penicillins Rash    Past Surgical History:  Procedure Laterality Date  . testicular cyst  2000    Social History  Substance Use Topics  . Smoking status: Current Every Day Smoker  . Smokeless tobacco: Current User  . Alcohol use 0.0 oz/week     Comment: occasional     Medication list has been reviewed and updated.   Physical Exam  Constitutional: He is oriented to person, place, and time. He appears well-developed. No distress.  HENT:  Head: Normocephalic and atraumatic.  Neck: Normal range of motion. Neck supple. Carotid bruit is not present. No thyromegaly present.  Cardiovascular: Normal rate, regular rhythm and normal heart sounds.   Pulmonary/Chest: Effort normal. No respiratory distress. He has wheezes (few upper lung wheezes).  Musculoskeletal: Normal range of motion.  Neurological: He is alert and oriented to person, place, and time.  Skin: Skin is warm and dry. No rash noted.  Psychiatric: He has a normal mood and affect. His speech is normal and behavior is normal. Thought content normal.  Nursing note and vitals reviewed.   BP 136/80   Pulse 82   Ht 5\' 11"  (1.803 m)   Wt 212 lb (96.2 kg)   SpO2 99%   BMI 29.57 kg/m   Assessment and Plan: 1. Generalized anxiety disorder Continue alprazolam PRN - ALPRAZolam (XANAX) 0.5 MG tablet; Take 0.5 tablets (0.25 mg total) by mouth 2 (two) times daily as needed for anxiety.  Dispense: 30 tablet; Refill: 5  2. Essential (primary) hypertension controlled - Comprehensive metabolic panel  3. Compulsive tobacco user syndrome Discussed reduction and attempts to quit completely   Meds ordered this encounter  Medications  . ALPRAZolam (XANAX) 0.5 MG tablet    Sig: Take 0.5 tablets (0.25 mg total) by mouth 2 (two) times daily as needed for  anxiety.    Dispense:  30 tablet    Refill:  5    Bari EdwardLaura Rianna Lukes, MD [facility]  09/08/2016

## 2016-09-08 NOTE — Patient Instructions (Signed)
DASH Eating Plan DASH stands for "Dietary Approaches to Stop Hypertension." The DASH eating plan is a healthy eating plan that has been shown to reduce high blood pressure (hypertension). It may also reduce your risk for type 2 diabetes, heart disease, and stroke. The DASH eating plan may also help with weight loss. What are tips for following this plan? General guidelines  Avoid eating more than 2,300 mg (milligrams) of salt (sodium) a day. If you have hypertension, you may need to reduce your sodium intake to 1,500 mg a day.  Limit alcohol intake to no more than 1 drink a day for nonpregnant women and 2 drinks a day for men. One drink equals 12 oz of beer, 5 oz of wine, or 1 oz of hard liquor.  Work with your health care provider to maintain a healthy body weight or to lose weight. Ask what an ideal weight is for you.  Get at least 30 minutes of exercise that causes your heart to beat faster (aerobic exercise) most days of the week. Activities may include walking, swimming, or biking.  Work with your health care provider or diet and nutrition specialist (dietitian) to adjust your eating plan to your individual calorie needs. Reading food labels  Check food labels for the amount of sodium per serving. Choose foods with less than 5 percent of the Daily Value of sodium. Generally, foods with less than 300 mg of sodium per serving fit into this eating plan.  To find whole grains, look for the word "whole" as the first word in the ingredient list. Shopping  Buy products labeled as "low-sodium" or "no salt added."  Buy fresh foods. Avoid canned foods and premade or frozen meals. Cooking  Avoid adding salt when cooking. Use salt-free seasonings or herbs instead of table salt or sea salt. Check with your health care provider or pharmacist before using salt substitutes.  Do not fry foods. Cook foods using healthy methods such as baking, boiling, grilling, and broiling instead.  Cook with  heart-healthy oils, such as olive, canola, soybean, or sunflower oil. Meal planning   Eat a balanced diet that includes: ? 5 or more servings of fruits and vegetables each day. At each meal, try to fill half of your plate with fruits and vegetables. ? Up to 6-8 servings of whole grains each day. ? Less than 6 oz of lean meat, poultry, or fish each day. A 3-oz serving of meat is about the same size as a deck of cards. One egg equals 1 oz. ? 2 servings of low-fat dairy each day. ? A serving of nuts, seeds, or beans 5 times each week. ? Heart-healthy fats. Healthy fats called Omega-3 fatty acids are found in foods such as flaxseeds and coldwater fish, like sardines, salmon, and mackerel.  Limit how much you eat of the following: ? Canned or prepackaged foods. ? Food that is high in trans fat, such as fried foods. ? Food that is high in saturated fat, such as fatty meat. ? Sweets, desserts, sugary drinks, and other foods with added sugar. ? Full-fat dairy products.  Do not salt foods before eating.  Try to eat at least 2 vegetarian meals each week.  Eat more home-cooked food and less restaurant, buffet, and fast food.  When eating at a restaurant, ask that your food be prepared with less salt or no salt, if possible. What foods are recommended? The items listed may not be a complete list. Talk with your dietitian about what   dietary choices are best for you. Grains Whole-grain or whole-wheat bread. Whole-grain or whole-wheat pasta. Brown rice. Oatmeal. Quinoa. Bulgur. Whole-grain and low-sodium cereals. Pita bread. Low-fat, low-sodium crackers. Whole-wheat flour tortillas. Vegetables Fresh or frozen vegetables (raw, steamed, roasted, or grilled). Low-sodium or reduced-sodium tomato and vegetable juice. Low-sodium or reduced-sodium tomato sauce and tomato paste. Low-sodium or reduced-sodium canned vegetables. Fruits All fresh, dried, or frozen fruit. Canned fruit in natural juice (without  added sugar). Meat and other protein foods Skinless chicken or turkey. Ground chicken or turkey. Pork with fat trimmed off. Fish and seafood. Egg whites. Dried beans, peas, or lentils. Unsalted nuts, nut butters, and seeds. Unsalted canned beans. Lean cuts of beef with fat trimmed off. Low-sodium, lean deli meat. Dairy Low-fat (1%) or fat-free (skim) milk. Fat-free, low-fat, or reduced-fat cheeses. Nonfat, low-sodium ricotta or cottage cheese. Low-fat or nonfat yogurt. Low-fat, low-sodium cheese. Fats and oils Soft margarine without trans fats. Vegetable oil. Low-fat, reduced-fat, or light mayonnaise and salad dressings (reduced-sodium). Canola, safflower, olive, soybean, and sunflower oils. Avocado. Seasoning and other foods Herbs. Spices. Seasoning mixes without salt. Unsalted popcorn and pretzels. Fat-free sweets. What foods are not recommended? The items listed may not be a complete list. Talk with your dietitian about what dietary choices are best for you. Grains Baked goods made with fat, such as croissants, muffins, or some breads. Dry pasta or rice meal packs. Vegetables Creamed or fried vegetables. Vegetables in a cheese sauce. Regular canned vegetables (not low-sodium or reduced-sodium). Regular canned tomato sauce and paste (not low-sodium or reduced-sodium). Regular tomato and vegetable juice (not low-sodium or reduced-sodium). Pickles. Olives. Fruits Canned fruit in a light or heavy syrup. Fried fruit. Fruit in cream or butter sauce. Meat and other protein foods Fatty cuts of meat. Ribs. Fried meat. Bacon. Sausage. Bologna and other processed lunch meats. Salami. Fatback. Hotdogs. Bratwurst. Salted nuts and seeds. Canned beans with added salt. Canned or smoked fish. Whole eggs or egg yolks. Chicken or turkey with skin. Dairy Whole or 2% milk, cream, and half-and-half. Whole or full-fat cream cheese. Whole-fat or sweetened yogurt. Full-fat cheese. Nondairy creamers. Whipped toppings.  Processed cheese and cheese spreads. Fats and oils Butter. Stick margarine. Lard. Shortening. Ghee. Bacon fat. Tropical oils, such as coconut, palm kernel, or palm oil. Seasoning and other foods Salted popcorn and pretzels. Onion salt, garlic salt, seasoned salt, table salt, and sea salt. Worcestershire sauce. Tartar sauce. Barbecue sauce. Teriyaki sauce. Soy sauce, including reduced-sodium. Steak sauce. Canned and packaged gravies. Fish sauce. Oyster sauce. Cocktail sauce. Horseradish that you find on the shelf. Ketchup. Mustard. Meat flavorings and tenderizers. Bouillon cubes. Hot sauce and Tabasco sauce. Premade or packaged marinades. Premade or packaged taco seasonings. Relishes. Regular salad dressings. Where to find more information:  National Heart, Lung, and Blood Institute: www.nhlbi.nih.gov  American Heart Association: www.heart.org Summary  The DASH eating plan is a healthy eating plan that has been shown to reduce high blood pressure (hypertension). It may also reduce your risk for type 2 diabetes, heart disease, and stroke.  With the DASH eating plan, you should limit salt (sodium) intake to 2,300 mg a day. If you have hypertension, you may need to reduce your sodium intake to 1,500 mg a day.  When on the DASH eating plan, aim to eat more fresh fruits and vegetables, whole grains, lean proteins, low-fat dairy, and heart-healthy fats.  Work with your health care provider or diet and nutrition specialist (dietitian) to adjust your eating plan to your individual   calorie needs. This information is not intended to replace advice given to you by your health care provider. Make sure you discuss any questions you have with your health care provider. Document Released: 02/17/2011 Document Revised: 02/22/2016 Document Reviewed: 02/22/2016 Elsevier Interactive Patient Education  2017 Elsevier Inc.  

## 2016-09-30 ENCOUNTER — Telehealth: Payer: Self-pay

## 2016-09-30 NOTE — Telephone Encounter (Signed)
Called pt and asked why he had not had his labs drawn at [facility]. Wife called back and left VM stating pt is paying out of pocket- want only necessary labs drawn- does not understand why we need labs every 6 months.- I called and left VM to explain that pt has not had labs drawn in over a year- she only drew one neccessary panel for him. And he can go to [facility] with his name and DOB and they can pull the order at that [facility]. Awaiting call back if questions about this.

## 2017-01-02 ENCOUNTER — Other Ambulatory Visit: Payer: Self-pay | Admitting: Internal Medicine

## 2017-01-02 DIAGNOSIS — I10 Essential (primary) hypertension: Secondary | ICD-10-CM

## 2017-03-15 ENCOUNTER — Encounter: Payer: Self-pay | Admitting: Internal Medicine

## 2017-03-21 ENCOUNTER — Telehealth: Payer: Self-pay

## 2017-03-21 NOTE — Telephone Encounter (Signed)
Patient wife called stating she DID NOT want her husband to know that she is calling. Just wanted to inform Dr Judithann GravesBerglund pt is taking Xanax as directed. She states patient is wonderful when Xanax is in affect but when it wears off he is completely irrational. Difficult to be around, family and daughter even noticed his anger. She said being at home when he is like this is "pure hell." I informed her she is not on the DPR so I cannot legally tell her any information about this patient and at his appointment tomorrow we will do regular screenings on him. If he admits to these problems she claims he is having Dr Judithann GravesBerglund will address them. If he doesn't admit to them himself then she cannot press him or change medications for him. She verbalized understanding.

## 2017-03-22 ENCOUNTER — Encounter: Payer: Self-pay | Admitting: Internal Medicine

## 2017-03-22 ENCOUNTER — Ambulatory Visit (INDEPENDENT_AMBULATORY_CARE_PROVIDER_SITE_OTHER): Payer: Self-pay | Admitting: Internal Medicine

## 2017-03-22 VITALS — BP 128/74 | HR 65 | Ht 71.0 in | Wt 207.0 lb

## 2017-03-22 DIAGNOSIS — Z0000 Encounter for general adult medical examination without abnormal findings: Secondary | ICD-10-CM

## 2017-03-22 DIAGNOSIS — F411 Generalized anxiety disorder: Secondary | ICD-10-CM

## 2017-03-22 DIAGNOSIS — E782 Mixed hyperlipidemia: Secondary | ICD-10-CM

## 2017-03-22 DIAGNOSIS — I498 Other specified cardiac arrhythmias: Secondary | ICD-10-CM

## 2017-03-22 DIAGNOSIS — I493 Ventricular premature depolarization: Secondary | ICD-10-CM

## 2017-03-22 DIAGNOSIS — I10 Essential (primary) hypertension: Secondary | ICD-10-CM

## 2017-03-22 LAB — POCT URINALYSIS DIPSTICK
Bilirubin, UA: NEGATIVE
Glucose, UA: NEGATIVE
Ketones, UA: NEGATIVE
LEUKOCYTES UA: NEGATIVE
NITRITE UA: NEGATIVE
PH UA: 7.5 (ref 5.0–8.0)
PROTEIN UA: NEGATIVE
RBC UA: NEGATIVE
Spec Grav, UA: 1.01 (ref 1.010–1.025)
UROBILINOGEN UA: 0.2 U/dL

## 2017-03-22 MED ORDER — ALPRAZOLAM 0.5 MG PO TABS: 0.2500 mg | ORAL_TABLET | Freq: Two times a day (BID) | ORAL | 5 refills | Status: DC | PRN

## 2017-03-22 MED ORDER — AMLODIPINE BESYLATE 5 MG PO TABS: 5.0000 mg | ORAL_TABLET | Freq: Every day | ORAL | 3 refills | Status: DC

## 2017-03-22 MED ORDER — HYDROCHLOROTHIAZIDE 25 MG PO TABS: 25.0000 mg | ORAL_TABLET | Freq: Every morning | ORAL | 3 refills | Status: DC

## 2017-03-22 NOTE — Patient Instructions (Signed)
Palpitations A palpitation is the feeling that your heartbeat is irregular or is faster than normal. It may feel like your heart is fluttering or skipping a beat. Palpitations are usually not a serious problem. They may be caused by many things, including smoking, caffeine, alcohol, stress, and certain medicines. Although most causes of palpitations are not serious, palpitations can be a sign of a serious medical problem. In some cases, you may need further medical evaluation. Follow these instructions at home: Pay attention to any changes in your symptoms. Take these actions to help with your condition:  Avoid the following: ? Caffeinated coffee, tea, soft drinks, diet pills, and energy drinks. ? Chocolate. ? Alcohol.  Do not use any tobacco products, such as cigarettes, chewing tobacco, and e-cigarettes. If you need help quitting, ask your health care provider.  Try to reduce your stress and anxiety. Things that can help you relax include: ? Yoga. ? Meditation. ? Physical activity, such as swimming, jogging, or walking. ? Biofeedback. This is a method that helps you learn to use your mind to control things in your body, such as your heartbeats.  Get plenty of rest and sleep.  Take over-the-counter and prescription medicines only as told by your health care provider.  Keep all follow-up visits as told by your health care provider. This is important.  Contact a health care provider if:  You continue to have a fast or irregular heartbeat after 24 hours.  Your palpitations occur more often. Get help right away if:  You have chest pain or shortness of breath.  You have a severe headache.  You feel dizzy or you faint. This information is not intended to replace advice given to you by your health care provider. Make sure you discuss any questions you have with your health care provider. Document Released: 02/26/2000 Document Revised: 08/03/2015 Document Reviewed: 11/13/2014 Elsevier  Interactive Patient Education  2018 Elsevier Inc.  

## 2017-03-22 NOTE — Progress Notes (Signed)
Date:  03/22/2017   Name:  Maurice Hill   DOB:  1970/05/29   MRN:  [id number]   Chief Complaint: Annual Exam Maurice Maurice Fotheringham is a 47 y.o. male who presents today for his Complete Annual Exam. He feels fairly well. He reports exercising rarely. He reports he is sleeping well.   He is taking low dose Xanax as needed.  He takes it most days for stress and anxiety related to work.  He has no issues with sleep.  He denies depression.   Hypertension  This is a chronic problem. The problem is controlled. Associated symptoms include anxiety. Pertinent negatives include no chest pain, headaches, palpitations or shortness of breath. Past treatments include diuretics and calcium channel blockers. The current treatment provides significant improvement.  Anxiety  Presents for follow-up visit. Symptoms include irritability and nervous/anxious behavior. Patient reports no chest pain, dizziness, palpitations, shortness of breath or suicidal ideas. Symptoms occur most days. The severity of symptoms is moderate. The quality of sleep is good.      Review of Systems  Constitutional: Positive for irritability. Negative for appetite change, chills, diaphoresis, fatigue and unexpected weight change.  HENT: Negative for hearing loss, tinnitus, trouble swallowing and voice change.   Eyes: Negative for visual disturbance.  Respiratory: Negative for choking, shortness of breath and wheezing.   Cardiovascular: Negative for chest pain, palpitations and leg swelling.  Gastrointestinal: Negative for abdominal pain, blood in stool, constipation and diarrhea.  Genitourinary: Negative for difficulty urinating, dysuria and frequency.  Musculoskeletal: Negative for arthralgias, back pain and myalgias.  Skin: Negative for color change and rash.  Neurological: Negative for dizziness, syncope and headaches.  Hematological: Negative for adenopathy.  Psychiatric/Behavioral: Negative for dysphoric mood,  sleep disturbance and suicidal ideas. The patient is nervous/anxious.     Patient Active Problem List   Diagnosis Date Noted  . Plantar fasciitis of left foot 09/02/2015  . Anxiety disorder 07/03/2015  . Essential (primary) hypertension 01/28/2015  . Chronic headache 01/28/2015  . HLD (hyperlipidemia) 01/28/2015  . Snores 01/28/2015  . Compulsive tobacco user syndrome 01/28/2015    Prior to Admission medications   Medication Sig Start Date End Date Taking? Authorizing Provider  ALPRAZolam Prudy Feeler) 0.5 MG tablet Take 0.5 tablets (0.25 mg total) by mouth 2 (two) times daily as needed for anxiety. 09/08/16  Yes Reubin Milan, MD  amLODipine (NORVASC) 5 MG tablet TAKE ONE TABLET BY MOUTH DAILY 07/12/16  Yes Reubin Milan, MD  hydrochlorothiazide (HYDRODIURIL) 25 MG tablet TAKE ONE TABLET BY MOUTH EVERY MORNING  01/03/17  Yes Reubin Milan, MD    Allergies  Allergen Reactions  . Penicillins Rash    Past Surgical History:  Procedure Laterality Date  . testicular cyst  2000    Social History   Tobacco Use  . Smoking status: Current Every Day Smoker  . Smokeless tobacco: Current User  Substance Use Topics  . Alcohol use: Yes    Alcohol/week: 0.0 oz    Comment: occasional  . Drug use: No     Medication list has been reviewed and updated.  PHQ 2/9 Scores 03/22/2017 09/02/2015  PHQ - 2 Score 0 0   GAD 7 : Generalized Anxiety Score 03/22/2017  Nervous, Anxious, on Edge 0  Control/stop worrying 0  Worry too much - different things 0  Trouble relaxing 0  Restless 0  Easily annoyed or irritable 3  Afraid - awful might happen 0  Total GAD 7 Score 3  Anxiety Difficulty Somewhat difficult      Physical Exam  Constitutional: He is oriented to person, place, and time. He appears well-developed and well-nourished.  HENT:  Head: Normocephalic.  Right Ear: Tympanic membrane, external ear and ear canal normal.  Left Ear: Tympanic membrane, external ear and ear canal  normal.  Nose: Nose normal.  Mouth/Throat: Uvula is midline and oropharynx is clear and moist.  Eyes: Conjunctivae and EOM are normal. Pupils are equal, round, and reactive to light.  Neck: Normal range of motion. Neck supple. Carotid bruit is not present. No thyromegaly present.  Cardiovascular: Normal rate, regular rhythm, normal heart sounds and intact distal pulses. Frequent extrasystoles are present.  No murmur heard. Pulmonary/Chest: Effort normal and breath sounds normal. He has no wheezes. Right breast exhibits no mass. Left breast exhibits no mass.  Abdominal: Soft. Normal appearance and bowel sounds are normal. There is no hepatosplenomegaly. There is no tenderness.  Musculoskeletal: Normal range of motion. He exhibits no edema or tenderness.  Lymphadenopathy:    He has no cervical adenopathy.  Neurological: He is alert and oriented to person, place, and time. He has normal reflexes.  Skin: Skin is warm, dry and intact.  Psychiatric: He has a normal mood and affect. His speech is normal and behavior is normal. Judgment and thought content normal.  Nursing note and vitals reviewed.   BP 128/74   Pulse 65   Ht 5\' 11"  (1.803 m)   Wt 207 lb (93.9 kg)   SpO2 99%   BMI 28.87 kg/m   Assessment and Plan: 1. Annual physical exam Normal exam except for PVCs  2. Essential (primary) hypertension controlled - CBC with Differential/Platelet - Comprehensive metabolic panel - hydrochlorothiazide (HYDRODIURIL) 25 MG tablet; Take 1 tablet (25 mg total) by mouth every morning.  Dispense: 90 tablet; Refill: 3 - amLODipine (NORVASC) 5 MG tablet; Take 1 tablet (5 mg total) by mouth daily.  Dispense: 90 tablet; Refill: 3 - POCT urinalysis dipstick  3. Generalized anxiety disorder Continue low dose benzo prn - TSH - ALPRAZolam (XANAX) 0.5 MG tablet; Take 0.5 tablets (0.25 mg total) by mouth 2 (two) times daily as needed for anxiety.  Dispense: 30 tablet; Refill: 5  4. Mixed  hyperlipidemia Not checked in 4 years - Lipid panel  5. Other cardiac arrhythmia Frequent PVCs likely due to excess caffeine intake - EKG 12-Lead - trigeminy, otherwise NSR rate 82 - Comprehensive metabolic panel  6. Frequent unifocal PVCs Education provided No treatment needed since asx - other than reducing caffeine intake - CBC with Differential/Platelet - TSH   Meds ordered this encounter  Medications  . hydrochlorothiazide (HYDRODIURIL) 25 MG tablet    Sig: Take 1 tablet (25 mg total) by mouth every morning.    Dispense:  90 tablet    Refill:  3  . amLODipine (NORVASC) 5 MG tablet    Sig: Take 1 tablet (5 mg total) by mouth daily.    Dispense:  90 tablet    Refill:  3  . ALPRAZolam (XANAX) 0.5 MG tablet    Sig: Take 0.5 tablets (0.25 mg total) by mouth 2 (two) times daily as needed for anxiety.    Dispense:  30 tablet    Refill:  5    Partially dictated using Animal nutritionistDragon software. Any errors are unintentional.  Bari EdwardLaura Maron Stanzione, MD [facility]  03/22/2017

## 2017-03-23 LAB — CBC WITH DIFFERENTIAL/PLATELET
BASOS ABS: 0 10*3/uL (ref 0.0–0.2)
Basos: 0 %
EOS (ABSOLUTE): 0.2 10*3/uL (ref 0.0–0.4)
Eos: 3 %
Hematocrit: 46.5 % (ref 37.5–51.0)
Hemoglobin: 15.9 g/dL (ref 13.0–17.7)
IMMATURE GRANS (ABS): 0 10*3/uL (ref 0.0–0.1)
Immature Granulocytes: 0 %
LYMPHS: 33 %
Lymphocytes Absolute: 2.3 10*3/uL (ref 0.7–3.1)
MCH: 31.4 pg (ref 26.6–33.0)
MCHC: 34.2 g/dL (ref 31.5–35.7)
MCV: 92 fL (ref 79–97)
Monocytes Absolute: 0.6 10*3/uL (ref 0.1–0.9)
Monocytes: 9 %
NEUTROS ABS: 3.7 10*3/uL (ref 1.4–7.0)
Neutrophils: 55 %
Platelets: 264 10*3/uL (ref 150–379)
RBC: 5.07 x10E6/uL (ref 4.14–5.80)
RDW: 14.7 % (ref 12.3–15.4)
WBC: 6.7 10*3/uL (ref 3.4–10.8)

## 2017-03-23 LAB — LIPID PANEL
CHOL/HDL RATIO: 4.6 ratio (ref 0.0–5.0)
Cholesterol, Total: 234 mg/dL — ABNORMAL HIGH (ref 100–199)
HDL: 51 mg/dL (ref >39)
LDL CALC: 163 mg/dL — AB (ref 0–99)
TRIGLYCERIDES: 101 mg/dL (ref 0–149)
VLDL Cholesterol Cal: 20 mg/dL (ref 5–40)

## 2017-03-23 LAB — COMPREHENSIVE METABOLIC PANEL
A/G RATIO: 1.8 (ref 1.2–2.2)
ALT: 29 IU/L (ref 0–44)
AST: 26 IU/L (ref 0–40)
Albumin: 4.8 g/dL (ref 3.5–5.5)
Alkaline Phosphatase: 61 IU/L (ref 39–117)
BILIRUBIN TOTAL: 0.7 mg/dL (ref 0.0–1.2)
BUN/Creatinine Ratio: 16 (ref 9–20)
BUN: 14 mg/dL (ref 6–24)
CO2: 25 mmol/L (ref 20–29)
Calcium: 9.9 mg/dL (ref 8.7–10.2)
Chloride: 99 mmol/L (ref 96–106)
Creatinine, Ser: 0.9 mg/dL (ref 0.76–1.27)
GFR, EST AFRICAN AMERICAN: 118 mL/min/{1.73_m2} (ref >59)
GFR, EST NON AFRICAN AMERICAN: 102 mL/min/{1.73_m2} (ref >59)
GLOBULIN, TOTAL: 2.6 g/dL (ref 1.5–4.5)
Glucose: 84 mg/dL (ref 65–99)
POTASSIUM: 4.1 mmol/L (ref 3.5–5.2)
SODIUM: 143 mmol/L (ref 134–144)
TOTAL PROTEIN: 7.4 g/dL (ref 6.0–8.5)

## 2017-03-23 LAB — TSH: TSH: 2.43 u[IU]/mL (ref 0.450–4.500)

## 2017-03-24 ENCOUNTER — Telehealth: Payer: Self-pay

## 2017-03-24 NOTE — Telephone Encounter (Signed)
Patient called stating his wife suggested he take time release XANAX. I informed him Dr Laban EmperorBergund doesn't typically prescribe time release medications. If he wanted a change in medicine he would need to come back in and discuss it with her in person. He verbalized understanding and said his wife just suggested it so another visit is not necessary.

## 2018-03-21 ENCOUNTER — Telehealth: Payer: Self-pay | Admitting: Internal Medicine

## 2018-03-21 ENCOUNTER — Telehealth: Payer: Self-pay

## 2018-03-21 NOTE — Telephone Encounter (Signed)
He is set up with a physical in February, but he will also need refills on amLODipine (NORVASC) 5 MG tablet [[id number]][[id number]]  hydrochlorothiazide (HYDRODIURIL) 25 MG tablet [[id number]][[id number]]

## 2018-03-21 NOTE — Telephone Encounter (Signed)
Patient called and left Vm stating he would like to reschedule his appt for Friday. Please call and reschedule but inform patient he will need to schedule a follow up if unable to get a physical in the next month and then schedule his physical for later in the year.  Please Advise.  CB# [phone]  Let him know medications were sent to pharmacy.

## 2018-03-21 NOTE — Telephone Encounter (Signed)
Sent medications. Thank you.

## 2018-03-23 ENCOUNTER — Encounter: Payer: Self-pay | Admitting: Internal Medicine

## 2018-04-10 ENCOUNTER — Other Ambulatory Visit: Payer: Self-pay | Admitting: Internal Medicine

## 2018-04-10 DIAGNOSIS — I10 Essential (primary) hypertension: Secondary | ICD-10-CM

## 2018-04-27 ENCOUNTER — Encounter: Payer: Self-pay | Admitting: Internal Medicine

## 2018-04-27 ENCOUNTER — Ambulatory Visit (INDEPENDENT_AMBULATORY_CARE_PROVIDER_SITE_OTHER): Payer: Self-pay | Admitting: Internal Medicine

## 2018-04-27 ENCOUNTER — Other Ambulatory Visit: Payer: Self-pay

## 2018-04-27 VITALS — BP 124/76 | HR 72 | Ht 71.0 in | Wt 216.0 lb

## 2018-04-27 DIAGNOSIS — F411 Generalized anxiety disorder: Secondary | ICD-10-CM

## 2018-04-27 DIAGNOSIS — F17200 Nicotine dependence, unspecified, uncomplicated: Secondary | ICD-10-CM

## 2018-04-27 DIAGNOSIS — Z0000 Encounter for general adult medical examination without abnormal findings: Secondary | ICD-10-CM

## 2018-04-27 DIAGNOSIS — E782 Mixed hyperlipidemia: Secondary | ICD-10-CM

## 2018-04-27 DIAGNOSIS — I10 Essential (primary) hypertension: Secondary | ICD-10-CM

## 2018-04-27 MED ORDER — ALPRAZOLAM 0.5 MG PO TABS: 0.2500 mg | ORAL_TABLET | Freq: Two times a day (BID) | ORAL | 5 refills | Status: DC | PRN

## 2018-04-27 NOTE — Progress Notes (Signed)
Date:  04/27/2018   Name:  Maurice Hill   DOB:  [birth year]   MRN:  [id number]   Chief Complaint: Annual Exam Maurice Reshard Kittelson is a 48 y.o. male who presents today for his Complete Annual Exam. He feels well. He reports exercising some. He reports he is sleeping fairly well. He continues to smoke cigarettes.  He has a low interest in quitting.  HPI  Review of Systems  Constitutional: Negative for appetite change, chills, diaphoresis, fatigue and unexpected weight change.  HENT: Negative for hearing loss, tinnitus, trouble swallowing and voice change.   Eyes: Negative for visual disturbance.  Respiratory: Negative for choking, shortness of breath and wheezing.   Cardiovascular: Negative for chest pain, palpitations and leg swelling.  Gastrointestinal: Negative for abdominal pain, blood in stool, constipation and diarrhea.  Genitourinary: Negative for difficulty urinating, dysuria and frequency.  Musculoskeletal: Negative for arthralgias, back pain and myalgias.  Skin: Negative for color change and rash.  Neurological: Negative for dizziness, syncope and headaches.  Hematological: Negative for adenopathy.  Psychiatric/Behavioral: Negative for dysphoric mood and sleep disturbance. The patient is nervous/anxious.     Patient Active Problem List   Diagnosis Date Noted  . 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 Plantar fasciitis of left foot 09/02/2015  . Anxiety disorder 07/03/2015  . Essential (primary) hypertension 01/28/2015  . Chronic headache 01/28/2015  . HLD (hyperlipidemia) 01/28/2015  . Snores 01/28/2015  . Compulsive tobacco user syndrome 01/28/2015    Allergies  Allergen Reactions  . Penicillins Rash    Past Surgical History:  Procedure Laterality Date  . testicular cyst  2000    Social History   Tobacco Use  . Smoking status: Current Every Day Smoker    Packs/day: 1.50    Years: 30.00    Pack years: 45.00    Types: Cigarettes  . Smokeless  tobacco: Current User  Substance Use Topics  . Alcohol use: Yes    Alcohol/week: 0.0 standard drinks    Comment: occasional  . Drug use: No     Medication list has been reviewed and updated.  Current Meds  Medication Sig  . ALPRAZolam (XANAX) 0.5 MG tablet Take 0.5 tablets (0.25 mg total) by mouth 2 (two) times daily as needed for anxiety.  Marland Kitchen amLODipine (NORVASC) 5 MG tablet TAKE ONE TABLET BY MOUTH ONE TIME DAILY   . hydrochlorothiazide (HYDRODIURIL) 25 MG tablet TAKE ONE TABLET BY MOUTH EVERY MORNING     PHQ 2/9 Scores 04/27/2018 03/22/2017 09/02/2015  PHQ - 2 Score 0 0 0   Wt Readings from Last 3 Encounters:  04/27/18 216 lb (98 kg)  03/22/17 207 lb (93.9 kg)  09/08/16 212 lb (96.2 kg)    Physical Exam Vitals signs and nursing note reviewed.  Constitutional:      Appearance: Normal appearance. He is well-developed.  HENT:     Head: Normocephalic.     Right Ear: Tympanic membrane, ear canal and external ear normal.     Left Ear: Tympanic membrane, ear canal and external ear normal.     Nose: Nose normal.     Mouth/Throat:     Pharynx: Uvula midline.  Eyes:     Conjunctiva/sclera: Conjunctivae normal.     Pupils: Pupils are equal, round, and reactive to light.  Neck:     Musculoskeletal: Normal range of motion and neck supple.     Thyroid: No thyromegaly.     Vascular: No carotid bruit.  Cardiovascular:  Rate and Rhythm: Normal rate and regular rhythm.     Heart sounds: Normal heart sounds.  Pulmonary:     Effort: Pulmonary effort is normal.     Breath sounds: Normal breath sounds. No wheezing.  Chest:     Breasts:        Right: No mass.        Left: No mass.  Abdominal:     General: Bowel sounds are normal.     Palpations: Abdomen is soft.     Tenderness: There is no abdominal tenderness.  Musculoskeletal: Normal range of motion.  Lymphadenopathy:     Cervical: No cervical adenopathy.  Skin:    General: Skin is warm and dry.  Neurological:     Mental  Status: He is alert and oriented to person, place, and time.     Deep Tendon Reflexes: Reflexes are normal and symmetric.  Psychiatric:        Speech: Speech normal.        Behavior: Behavior normal.        Thought Content: Thought content normal.        Judgment: Judgment normal.     BP 124/76   Pulse 72   Ht 5\' 11"  (1.803 m)   Wt 216 lb (98 kg)   SpO2 97%   BMI 30.13 kg/m   Assessment and Plan: 1. Annual physical exam Normal exam except for weight Work on diet and exercise  2. Generalized anxiety disorder Chronic, stable - ALPRAZolam (XANAX) 0.5 MG tablet; Take 0.5 tablets (0.25 mg total) by mouth 2 (two) times daily as needed for anxiety.  Dispense: 30 tablet; Refill: 5  3. Essential (primary) hypertension controlled - CBC with Differential/Platelet - Comprehensive metabolic panel  4. Mixed hyperlipidemia - Lipid panel  5. Compulsive tobacco user syndrome Could try Wellbutrin    Partially dictated using Dragon software. Any errors are unintentional.  Bari Edward, MD [facility]  04/27/2018

## 2018-04-27 NOTE — Patient Instructions (Signed)
Steps to Quit Smoking  Smoking tobacco can be harmful to your health and can affect almost every organ in your body. Smoking puts you, and those around you, at risk for developing many serious chronic diseases. Quitting smoking is difficult, but it is one of the best things that you can do for your health. It is never too late to quit. What are the benefits of quitting smoking? When you quit smoking, you lower your risk of developing serious diseases and conditions, such as:  Lung cancer or lung disease, such as COPD.  Heart disease.  Stroke.  Heart attack.  Infertility.  Osteoporosis and bone fractures. Additionally, symptoms such as coughing, wheezing, and shortness of breath may get better when you quit. You may also find that you get sick less often because your body is stronger at fighting off colds and infections. If you are pregnant, quitting smoking can help to reduce your chances of having a baby of low birth weight. How do I get ready to quit? When you decide to quit smoking, create a plan to make sure that you are successful. Before you quit:  Pick a date to quit. Set a date within the next two weeks to give you time to prepare.  Write down the reasons why you are quitting. Keep this list in places where you will see it often, such as on your bathroom mirror or in your car or wallet.  Identify the people, places, things, and activities that make you want to smoke (triggers) and avoid them. Make sure to take these actions: ? Throw away all cigarettes at home, at work, and in your car. ? Throw away smoking accessories, such as ashtrays and lighters. ? Clean your car and make sure to empty the ashtray. ? Clean your home, including curtains and carpets.  Tell your family, friends, and coworkers that you are quitting. Support from your loved ones can make quitting easier.  Talk with your health care provider about your options for quitting smoking.  Find out what treatment  options are covered by your health insurance. What strategies can I use to quit smoking? Talk with your healthcare provider about different strategies to quit smoking. Some strategies include:  Quitting smoking altogether instead of gradually lessening how much you smoke over a period of time. Research shows that quitting "cold turkey" is more successful than gradually quitting.  Attending in-person counseling to help you build problem-solving skills. You are more likely to have success in quitting if you attend several counseling sessions. Even short sessions of 10 minutes can be effective.  Finding resources and support systems that can help you to quit smoking and remain smoke-free after you quit. These resources are most helpful when you use them often. They can include: ? Online chats with a counselor. ? Telephone quitlines. ? Printed self-help materials. ? Support groups or group counseling. ? Text messaging programs. ? Mobile phone applications.  Taking medicines to help you quit smoking. (If you are pregnant or breastfeeding, talk with your health care provider first.) Some medicines contain nicotine and some do not. Both types of medicines help with cravings, but the medicines that include nicotine help to relieve withdrawal symptoms. Your health care provider may recommend: ? Nicotine patches, gum, or lozenges. ? Nicotine inhalers or sprays. ? Non-nicotine medicine that is taken by mouth. Talk with your health care provider about combining strategies, such as taking medicines while you are also receiving in-person counseling. Using these two strategies together makes you   more likely to succeed in quitting than if you used either strategy on its own. If you are pregnant or breastfeeding, talk with your health care provider about finding counseling or other support strategies to quit smoking. Do not take medicine to help you quit smoking unless told to do so by your health care  provider. What things can I do to make it easier to quit? Quitting smoking might feel overwhelming at first, but there is a lot that you can do to make it easier. Take these important actions:  Reach out to your family and friends and ask that they support and encourage you during this time. Call telephone quitlines, reach out to support groups, or work with a counselor for support.  Ask people who smoke to avoid smoking around you.  Avoid places that trigger you to smoke, such as bars, parties, or smoke-break areas at work.  Spend time around people who do not smoke.  Lessen stress in your life, because stress can be a smoking trigger for some people. To lessen stress, try: ? Exercising regularly. ? Deep-breathing exercises. ? Yoga. ? Meditating. ? Performing a body scan. This involves closing your eyes, scanning your body from head to toe, and noticing which parts of your body are particularly tense. Purposefully relax the muscles in those areas.  Download or purchase mobile phone or tablet apps (applications) that can help you stick to your quit plan by providing reminders, tips, and encouragement. There are many free apps, such as QuitGuide from the CDC (Centers for Disease Control and Prevention). You can find other support for quitting smoking (smoking cessation) through smokefree.gov and other websites. How will I feel when I quit smoking? Within the first 24 hours of quitting smoking, you may start to feel some withdrawal symptoms. These symptoms are usually most noticeable 2-3 days after quitting, but they usually do not last beyond 2-3 weeks. Changes or symptoms that you might experience include:  Mood swings.  Restlessness, anxiety, or irritation.  Difficulty concentrating.  Dizziness.  Strong cravings for sugary foods in addition to nicotine.  Mild weight gain.  Constipation.  Nausea.  Coughing or a sore throat.  Changes in how your medicines work in your  body.  A depressed mood.  Difficulty sleeping (insomnia). After the first 2-3 weeks of quitting, you may start to notice more positive results, such as:  Improved sense of smell and taste.  Decreased coughing and sore throat.  Slower heart rate.  Lower blood pressure.  Clearer skin.  The ability to breathe more easily.  Fewer sick days. Quitting smoking is very challenging for most people. Do not get discouraged if you are not successful the first time. Some people need to make many attempts to quit before they achieve long-term success. Do your best to stick to your quit plan, and talk with your health care provider if you have any questions or concerns. This information is not intended to replace advice given to you by your health care provider. Make sure you discuss any questions you have with your health care provider. Document Released: 02/22/2001 Document Revised: 10/04/2016 Document Reviewed: 07/15/2014 Elsevier Interactive Patient Education  2019 [employer]  

## 2018-04-28 LAB — COMPREHENSIVE METABOLIC PANEL
ALBUMIN: 4.3 g/dL (ref 4.0–5.0)
ALK PHOS: 62 IU/L (ref 39–117)
ALT: 22 IU/L (ref 0–44)
AST: 24 IU/L (ref 0–40)
Albumin/Globulin Ratio: 1.8 (ref 1.2–2.2)
BILIRUBIN TOTAL: 0.5 mg/dL (ref 0.0–1.2)
BUN / CREAT RATIO: 15 (ref 9–20)
BUN: 14 mg/dL (ref 6–24)
CHLORIDE: 102 mmol/L (ref 96–106)
CO2: 26 mmol/L (ref 20–29)
CREATININE: 0.96 mg/dL (ref 0.76–1.27)
Calcium: 9.6 mg/dL (ref 8.7–10.2)
GFR calc non Af Amer: 94 mL/min/{1.73_m2} (ref >59)
GFR, EST AFRICAN AMERICAN: 108 mL/min/{1.73_m2} (ref >59)
GLOBULIN, TOTAL: 2.4 g/dL (ref 1.5–4.5)
GLUCOSE: 97 mg/dL (ref 65–99)
Potassium: 3.9 mmol/L (ref 3.5–5.2)
SODIUM: 144 mmol/L (ref 134–144)
TOTAL PROTEIN: 6.7 g/dL (ref 6.0–8.5)

## 2018-04-28 LAB — LIPID PANEL
Chol/HDL Ratio: 4 ratio (ref 0.0–5.0)
Cholesterol, Total: 180 mg/dL (ref 100–199)
HDL: 45 mg/dL (ref >39)
LDL Calculated: 114 mg/dL — ABNORMAL HIGH (ref 0–99)
Triglycerides: 105 mg/dL (ref 0–149)
VLDL Cholesterol Cal: 21 mg/dL (ref 5–40)

## 2018-04-28 LAB — CBC WITH DIFFERENTIAL/PLATELET
BASOS ABS: 0 10*3/uL (ref 0.0–0.2)
Basos: 1 %
EOS (ABSOLUTE): 0.2 10*3/uL (ref 0.0–0.4)
EOS: 3 %
HEMATOCRIT: 44.3 % (ref 37.5–51.0)
HEMOGLOBIN: 15.1 g/dL (ref 13.0–17.7)
IMMATURE GRANS (ABS): 0 10*3/uL (ref 0.0–0.1)
Immature Granulocytes: 0 %
LYMPHS ABS: 2.4 10*3/uL (ref 0.7–3.1)
LYMPHS: 36 %
MCH: 31.1 pg (ref 26.6–33.0)
MCHC: 34.1 g/dL (ref 31.5–35.7)
MCV: 91 fL (ref 79–97)
MONOCYTES: 10 %
Monocytes Absolute: 0.7 10*3/uL (ref 0.1–0.9)
NEUTROS ABS: 3.3 10*3/uL (ref 1.4–7.0)
Neutrophils: 50 %
Platelets: 251 10*3/uL (ref 150–450)
RBC: 4.85 x10E6/uL (ref 4.14–5.80)
RDW: 13.1 % (ref 11.6–15.4)
WBC: 6.6 10*3/uL (ref 3.4–10.8)

## 2018-04-30 NOTE — Progress Notes (Signed)
Please call pt regarding labs.   Thank you.

## 2018-06-19 ENCOUNTER — Ambulatory Visit (INDEPENDENT_AMBULATORY_CARE_PROVIDER_SITE_OTHER): Payer: Self-pay | Admitting: Internal Medicine

## 2018-06-19 ENCOUNTER — Other Ambulatory Visit: Payer: Self-pay

## 2018-06-19 ENCOUNTER — Encounter: Payer: Self-pay | Admitting: Internal Medicine

## 2018-06-19 VITALS — BP 119/71 | HR 84 | Temp 98.8°F | Resp 16 | Ht 71.0 in | Wt 212.0 lb

## 2018-06-19 DIAGNOSIS — N201 Calculus of ureter: Secondary | ICD-10-CM

## 2018-06-19 DIAGNOSIS — R319 Hematuria, unspecified: Secondary | ICD-10-CM

## 2018-06-19 LAB — POC URINALYSIS WITH MICROSCOPIC (NON AUTO)MANUAL RESULT
Bacteria, UA: 0
Bilirubin, UA: NEGATIVE
Crystals: 0
Epithelial cells, urine per micros: 0
Glucose, UA: NEGATIVE
Ketones, UA: NEGATIVE
Mucus, UA: 0
Nitrite, UA: NEGATIVE
Protein, UA: NEGATIVE
RBC: 15 M/uL — AB (ref 4.69–6.13)
Spec Grav, UA: 1.01 (ref 1.010–1.025)
Urobilinogen, UA: 0.2 E.U./dL
WBC Casts, UA: 0
pH, UA: 6 (ref 5.0–8.0)

## 2018-06-19 MED ORDER — SULFAMETHOXAZOLE-TRIMETHOPRIM 800-160 MG PO TABS: 1.0000 | ORAL_TABLET | Freq: Two times a day (BID) | ORAL | 0 refills | Status: DC

## 2018-06-19 MED ORDER — TAMSULOSIN HCL 0.4 MG PO CAPS: 0.4000 mg | ORAL_CAPSULE | Freq: Every day | ORAL | 0 refills | Status: DC

## 2018-06-19 NOTE — Progress Notes (Signed)
Date:  06/19/2018   Name:  Maurice Hill   DOB:  07/19/1970   MRN:  [id number]   Chief Complaint: Testicle Pain (Severe on Sunday like pulling pain from groin area. Had hours of pain until stretching. Then went 24 hours and felt fine. Then hit again this morning. ) and Constipation (Can not pass gas or make a BM. )  Testicle Pain  The patient's primary symptoms include testicular pain. The patient's pertinent negatives include no genital injury, genital lesions, penile discharge or scrotal swelling. This is a new problem. The current episode started yesterday. The problem occurs intermittently. The problem has been unchanged. Associated symptoms include abdominal pain and constipation. Pertinent negatives include no chest pain, chills, fever or shortness of breath.  Constipation  This is a new problem. The current episode started in the past 7 days. His stool frequency is 1 time per day. The stool is described as pellet like. There has been adequate water intake. Associated symptoms include abdominal pain, bloating, difficulty urinating and rectal pain. Pertinent negatives include no fever, hematochezia, hemorrhoids or weight loss.   He describes severe pain that comes in waves in his right lower abdomen radiating to his right testicle.  The pain lasts briefly, then resolves.  Currently no pain. Two days ago when this started, he had blood in his urine.  Yesterday he started drinking more water and today urine appears clear. He had a stone in 2006 which he passed.  CT at that time showed another 2 mm right renal stone.   Review of Systems  Constitutional: Negative for chills, fever and weight loss.  Respiratory: Negative for shortness of breath and wheezing.   Cardiovascular: Negative for chest pain, palpitations and leg swelling.  Gastrointestinal: Positive for abdominal pain, bloating, constipation and rectal pain. Negative for hematochezia and hemorrhoids.  Genitourinary:  Positive for difficulty urinating, hematuria (two days ago but clear now) and testicular pain. Negative for discharge and scrotal swelling.  Hematological: Negative for adenopathy.    Patient Active Problem List   Diagnosis Date Noted  . 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 Plantar fasciitis of left foot 09/02/2015  . Anxiety disorder 07/03/2015  . Essential (primary) hypertension 01/28/2015  . Chronic headache 01/28/2015  . HLD (hyperlipidemia) 01/28/2015  . Snores 01/28/2015  . Compulsive tobacco user syndrome 01/28/2015    Allergies  Allergen Reactions  . Penicillins Rash    Past Surgical History:  Procedure Laterality Date  . testicular cyst  2000    Social History   Tobacco Use  . Smoking status: Current Every Day Smoker    Packs/day: 1.50    Years: 30.00    Pack years: 45.00    Types: Cigarettes    Start date: 06/18/1988  . Smokeless tobacco: Former Neurosurgeon    Types: Chew    Quit date: 04/27/2008  Substance Use Topics  . Alcohol use: Not Currently    Alcohol/week: 0.0 standard drinks    Comment: occasional  . Drug use: No     Medication list has been reviewed and updated.  Current Meds  Medication Sig  . ALPRAZolam (XANAX) 0.5 MG tablet Take 0.5 tablets (0.25 mg total) by mouth 2 (two) times daily as needed for anxiety.  Marland Kitchen amLODipine (NORVASC) 5 MG tablet TAKE ONE TABLET BY MOUTH ONE TIME DAILY   . hydrochlorothiazide (HYDRODIURIL) 25 MG tablet TAKE ONE TABLET BY MOUTH EVERY MORNING     PHQ 2/9 Scores 04/27/2018 03/22/2017 09/02/2015  PHQ -  2 Score 0 0 0    BP Readings from Last 3 Encounters:  06/19/18 119/71  04/27/18 124/76  03/22/17 128/74    Physical Exam Vitals signs and nursing note reviewed.  Constitutional:      General: He is not in acute distress.    Appearance: Normal appearance. He is well-developed.  HENT:     Head: Normocephalic and atraumatic.  Neck:     Musculoskeletal: Normal range of motion and neck supple.  Cardiovascular:      Rate and Rhythm: Normal rate and regular rhythm.     Pulses: Normal pulses.  Pulmonary:     Effort: Pulmonary effort is normal. No respiratory distress.     Breath sounds: No wheezing or rales.  Abdominal:     General: Bowel sounds are normal.     Palpations: Abdomen is soft.     Tenderness: There is no abdominal tenderness.     Hernia: No hernia is present.  Musculoskeletal: Normal range of motion.  Skin:    General: Skin is warm and dry.     Findings: No rash.  Neurological:     Mental Status: He is alert and oriented to person, place, and time.  Psychiatric:        Behavior: Behavior normal.        Thought Content: Thought content normal.     Wt Readings from Last 3 Encounters:  06/19/18 212 lb (96.2 kg)  04/27/18 216 lb (98 kg)  03/22/17 207 lb (93.9 kg)    BP 119/71   Pulse 84   Temp 98.8 F (37.1 C)   Resp 16   Ht 5\' 11"  (1.803 m)   Wt 212 lb (96.2 kg)   SpO2 100%   BMI 29.57 kg/m   Assessment and Plan: 1. Hematuria, unspecified type Will cover for UTI, prostatitis, epididymitis - sulfamethoxazole-trimethoprim (BACTRIM DS,SEPTRA DS) 800-160 MG tablet; Take 1 tablet by mouth 2 (two) times daily for 10 days.  Dispense: 20 tablet; Refill: 0 - POC urinalysis w microscopic (non auto)  2. Right ureteral stone Highly suspected; continue to drink fluids, add flomax Stop laxatives - tamsulosin (FLOMAX) 0.4 MG CAPS capsule; Take 1 capsule (0.4 mg total) by mouth at bedtime.  Dispense: 14 capsule; Refill: 0   Partially dictated using Animal nutritionistDragon software. Any errors are unintentional.  Bari EdwardLaura Saharah Sherrow, MD [facility]  06/19/2018

## 2019-04-05 ENCOUNTER — Other Ambulatory Visit: Payer: Self-pay | Admitting: Internal Medicine

## 2019-04-05 DIAGNOSIS — I10 Essential (primary) hypertension: Secondary | ICD-10-CM

## 2019-04-14 ENCOUNTER — Other Ambulatory Visit: Payer: Self-pay | Admitting: Internal Medicine

## 2019-04-14 DIAGNOSIS — I10 Essential (primary) hypertension: Secondary | ICD-10-CM

## 2019-05-02 ENCOUNTER — Encounter: Payer: Self-pay | Admitting: Internal Medicine

## 2019-07-19 ENCOUNTER — Other Ambulatory Visit: Payer: Self-pay

## 2019-07-19 ENCOUNTER — Encounter: Payer: Self-pay | Admitting: Internal Medicine

## 2019-07-19 ENCOUNTER — Ambulatory Visit (INDEPENDENT_AMBULATORY_CARE_PROVIDER_SITE_OTHER): Payer: Self-pay | Admitting: Internal Medicine

## 2019-07-19 VITALS — BP 130/78 | HR 91 | Ht 64.0 in | Wt 220.0 lb

## 2019-07-19 DIAGNOSIS — F17200 Nicotine dependence, unspecified, uncomplicated: Secondary | ICD-10-CM

## 2019-07-19 DIAGNOSIS — Z0000 Encounter for general adult medical examination without abnormal findings: Secondary | ICD-10-CM

## 2019-07-19 DIAGNOSIS — Z800 Family history of malignant neoplasm of digestive organs: Secondary | ICD-10-CM | POA: Insufficient documentation

## 2019-07-19 DIAGNOSIS — I10 Essential (primary) hypertension: Secondary | ICD-10-CM

## 2019-07-19 DIAGNOSIS — F411 Generalized anxiety disorder: Secondary | ICD-10-CM

## 2019-07-19 DIAGNOSIS — Z1211 Encounter for screening for malignant neoplasm of colon: Secondary | ICD-10-CM

## 2019-07-19 MED ORDER — HYDROCHLOROTHIAZIDE 25 MG PO TABS: 25.0000 mg | ORAL_TABLET | Freq: Every morning | ORAL | 3 refills | Status: DC

## 2019-07-19 MED ORDER — AMLODIPINE BESYLATE 5 MG PO TABS: 5.0000 mg | ORAL_TABLET | Freq: Every day | ORAL | 3 refills | Status: DC

## 2019-07-19 MED ORDER — ALPRAZOLAM 0.5 MG PO TABS: 0.2500 mg | ORAL_TABLET | Freq: Two times a day (BID) | ORAL | 5 refills | Status: DC | PRN

## 2019-07-19 NOTE — Progress Notes (Signed)
Date:  07/19/2019   Name:  Maurice Hill   DOB:  October 03, 1970   MRN:  [id number]   Chief Complaint: Annual Exam Maurice Eliyah Bazzi is a 49 y.o. male who presents today for his Complete Annual Exam. He feels well. He reports exercising hard at work. He reports he is sleeping well.   Immunization History  Administered Date(s) Administered  . PFIZER SARS-COV-2 Vaccination 06/05/2019, 06/26/2019  . Tdap 03/15/2010    Hypertension This is a chronic problem. The problem is controlled. Associated symptoms include anxiety. Pertinent negatives include no chest pain, headaches, palpitations or shortness of breath. Past treatments include calcium channel blockers and diuretics.  Anxiety Presents for follow-up visit. Patient reports no chest pain, dizziness, palpitations or shortness of breath. Symptoms occur occasionally.      Lab Results  Component Value Date   CREATININE 0.96 04/27/2018   BUN 14 04/27/2018   NA 144 04/27/2018   K 3.9 04/27/2018   CL 102 04/27/2018   CO2 26 04/27/2018   Lab Results  Component Value Date   CHOL 180 04/27/2018   HDL 45 04/27/2018   LDLCALC 114 (H) 04/27/2018   TRIG 105 04/27/2018   CHOLHDL 4.0 04/27/2018   Lab Results  Component Value Date   TSH 2.430 03/22/2017   No results found for: HGBA1C Lab Results  Component Value Date   WBC 6.6 04/27/2018   HGB 15.1 04/27/2018   HCT 44.3 04/27/2018   MCV 91 04/27/2018   PLT 251 04/27/2018   Lab Results  Component Value Date   ALT 22 04/27/2018   AST 24 04/27/2018   ALKPHOS 62 04/27/2018   BILITOT 0.5 04/27/2018    Review of Systems  Constitutional: Negative for appetite change, chills, diaphoresis, fatigue and unexpected weight change.  HENT: Negative for hearing loss, tinnitus, trouble swallowing and voice change.   Eyes: Negative for visual disturbance.  Respiratory: Negative for choking, shortness of breath and wheezing.   Cardiovascular: Negative for chest pain,  palpitations and leg swelling.  Gastrointestinal: Negative for abdominal pain, blood in stool, constipation and diarrhea.  Genitourinary: Negative for difficulty urinating, dysuria and frequency.  Musculoskeletal: Negative for arthralgias, back pain and myalgias.  Skin: Negative for color change and rash.  Neurological: Negative for dizziness, syncope and headaches.  Hematological: Negative for adenopathy.  Psychiatric/Behavioral: Negative for dysphoric mood and sleep disturbance.    Patient Active Problem List   Diagnosis Date Noted  . Family hx of colon cancer 07/19/2019  . 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 Plantar fasciitis of left foot 09/02/2015  . Anxiety disorder 07/03/2015  . Essential (primary) hypertension 01/28/2015  . Chronic headache 01/28/2015  . HLD (hyperlipidemia) 01/28/2015  . Snores 01/28/2015  . Compulsive tobacco user syndrome 01/28/2015    Allergies  Allergen Reactions  . Penicillins Rash    Past Surgical History:  Procedure Laterality Date  . testicular cyst  2000    Social History   Tobacco Use  . Smoking status: Current Every Day Smoker    Packs/day: 1.00    Years: 30.00    Pack years: 30.00    Types: Cigarettes    Start date: 06/18/1988  . Smokeless tobacco: Former Systems developer    Types: Chew    Quit date: 04/27/2008  Substance Use Topics  . Alcohol use: Not Currently    Alcohol/week: 0.0 standard drinks    Comment: occasional  . Drug use: No    Medication list has been reviewed and updated.  Current Meds  Medication Sig  . ALPRAZolam (XANAX) 0.5 MG tablet Take 0.5 tablets (0.25 mg total) by mouth 2 (two) times daily as needed for anxiety.  Marland Kitchen amLODipine (NORVASC) 5 MG tablet TAKE ONE TABLET BY MOUTH ONE TIME DAILY.  . hydrochlorothiazide (HYDRODIURIL) 25 MG tablet TAKE ONE TABLET BY MOUTH EVERY MORNING     PHQ 2/9 Scores 07/19/2019 04/27/2018 03/22/2017 09/02/2015  PHQ - 2 Score 2 0 0 0  PHQ- 9 Score 2 - - -    BP Readings from Last 3  Encounters:  07/19/19 130/78  06/19/18 119/71  04/27/18 124/76    Physical Exam Vitals and nursing note reviewed.  Constitutional:      Appearance: Normal appearance. He is well-developed.  HENT:     Head: Normocephalic.     Right Ear: Tympanic membrane, ear canal and external ear normal.     Left Ear: Tympanic membrane, ear canal and external ear normal.     Nose: Nose normal.     Mouth/Throat:     Pharynx: Uvula midline.  Eyes:     Conjunctiva/sclera: Conjunctivae normal.     Pupils: Pupils are equal, round, and reactive to light.  Neck:     Thyroid: No thyromegaly.     Vascular: No carotid bruit.  Cardiovascular:     Rate and Rhythm: Normal rate and regular rhythm.     Pulses: Normal pulses.     Heart sounds: Normal heart sounds.  Pulmonary:     Effort: Pulmonary effort is normal.     Breath sounds: Normal breath sounds. No wheezing.  Chest:     Breasts:        Right: No mass.        Left: No mass.  Abdominal:     General: Bowel sounds are normal.     Palpations: Abdomen is soft.     Tenderness: There is no abdominal tenderness.  Musculoskeletal:        General: Normal range of motion.     Cervical back: Normal range of motion and neck supple.     Right lower leg: No edema.     Left lower leg: No edema.  Lymphadenopathy:     Cervical: No cervical adenopathy.  Skin:    General: Skin is warm and dry.     Capillary Refill: Capillary refill takes less than 2 seconds.  Neurological:     General: No focal deficit present.     Mental Status: He is alert and oriented to person, place, and time.     Deep Tendon Reflexes: Reflexes are normal and symmetric.  Psychiatric:        Speech: Speech normal.        Behavior: Behavior normal.        Thought Content: Thought content normal.        Judgment: Judgment normal.     Wt Readings from Last 3 Encounters:  07/19/19 220 lb (99.8 kg)  06/19/18 212 lb (96.2 kg)  04/27/18 216 lb (98 kg)    BP 130/78   Pulse 91    Ht 5\' 4"  (1.626 m)   Wt 220 lb (99.8 kg)   SpO2 97%   BMI 37.76 kg/m   Assessment and Plan: 1. Essential (primary) hypertension Clinically stable exam with well controlled BP on amlodipine and hctz. Tolerating medications without side effects at this time. Pt to continue current regimen and low sodium diet; benefits of regular exercise as able discussed. Continue to work on  smoking cessaton - Comprehensive metabolic panel - amLODipine (NORVASC) 5 MG tablet; Take 1 tablet (5 mg total) by mouth daily.  Dispense: 90 tablet; Refill: 3 - hydrochlorothiazide (HYDRODIURIL) 25 MG tablet; Take 1 tablet (25 mg total) by mouth every morning.  Dispense: 90 tablet; Refill: 3  2. Annual physical exam Pt is up to date on Tdap and Covid vaccines - Lipid panel  3. Generalized anxiety disorder Using xanax about 2-3 times per week.  Last Rx with 5 RF 14 mo ago - ALPRAZolam (XANAX) 0.5 MG tablet; Take 0.5 tablets (0.25 mg total) by mouth 2 (two) times daily as needed for anxiety.  Dispense: 30 tablet; Refill: 5  4. Compulsive tobacco user syndrome Smoking about 1 ppd - down from 2 ppd He will continue to work on cutting back to quit completely if possible  5. Colon cancer screening Father had colon cancer around age 75 - Fecal occult blood, imunochemical   Partially dictated using Animal nutritionist. Any errors are unintentional.  Bari Edward, MD [facility]  07/19/2019

## 2019-07-20 LAB — COMPREHENSIVE METABOLIC PANEL
ALT: 21 IU/L (ref 0–44)
AST: 28 IU/L (ref 0–40)
Albumin/Globulin Ratio: 1.6 (ref 1.2–2.2)
Albumin: 4.5 g/dL (ref 4.0–5.0)
Alkaline Phosphatase: 75 IU/L (ref 39–117)
BUN/Creatinine Ratio: 18 (ref 9–20)
BUN: 16 mg/dL (ref 6–24)
Bilirubin Total: 0.2 mg/dL (ref 0.0–1.2)
CO2: 25 mmol/L (ref 20–29)
Calcium: 10 mg/dL (ref 8.7–10.2)
Chloride: 103 mmol/L (ref 96–106)
Creatinine, Ser: 0.9 mg/dL (ref 0.76–1.27)
GFR calc Af Amer: 116 mL/min/{1.73_m2} (ref >59)
GFR calc non Af Amer: 101 mL/min/{1.73_m2} (ref >59)
Globulin, Total: 2.9 g/dL (ref 1.5–4.5)
Glucose: 97 mg/dL (ref 65–99)
Potassium: 3.8 mmol/L (ref 3.5–5.2)
Sodium: 144 mmol/L (ref 134–144)
Total Protein: 7.4 g/dL (ref 6.0–8.5)

## 2019-07-20 LAB — LIPID PANEL
Chol/HDL Ratio: 4.3 ratio (ref 0.0–5.0)
Cholesterol, Total: 202 mg/dL — ABNORMAL HIGH (ref 100–199)
HDL: 47 mg/dL (ref >39)
LDL Chol Calc (NIH): 121 mg/dL — ABNORMAL HIGH (ref 0–99)
Triglycerides: 196 mg/dL — ABNORMAL HIGH (ref 0–149)
VLDL Cholesterol Cal: 34 mg/dL (ref 5–40)

## 2019-10-17 ENCOUNTER — Telehealth: Payer: Self-pay | Admitting: Internal Medicine

## 2019-10-17 ENCOUNTER — Other Ambulatory Visit: Payer: Self-pay

## 2019-10-17 DIAGNOSIS — N201 Calculus of ureter: Secondary | ICD-10-CM

## 2019-10-17 DIAGNOSIS — R319 Hematuria, unspecified: Secondary | ICD-10-CM

## 2019-10-17 MED ORDER — TAMSULOSIN HCL 0.4 MG PO CAPS: 0.4000 mg | ORAL_CAPSULE | Freq: Every day | ORAL | 0 refills | Status: DC

## 2019-10-17 MED ORDER — SULFAMETHOXAZOLE-TRIMETHOPRIM 800-160 MG PO TABS: 1.0000 | ORAL_TABLET | Freq: Two times a day (BID) | ORAL | 0 refills | Status: AC

## 2019-10-17 NOTE — Telephone Encounter (Signed)
Spoke to patients wife. Explained that I am unable to send in medications without speaking to Dr Judithann Graves about this and she will not be back until Monday. Told wife Leotis Shames- it may be best to take patient to Urgent Care for treatment of the stone.   She stated he currently does not have insurance so he cannot afford to see Urgent Care at this time. She said he was seen for this same problem twice before and both times Dr Judithann Graves filed a antibiotic and flomax to help pass the stone.  Told her this will be a one time thing, but refilled the sulfa abx and flomax rx given last April 2020 and told her if he does not get better then he has to come see Dr Judithann Graves or go to the ER.  She verbalized understanding and thanked me for helping them in their financial struggle. She said he has been "ill and uncomfortable and this has helped each time before."  CM

## 2019-10-17 NOTE — Telephone Encounter (Signed)
Patient's wife is calling again because she did not get a response back from the nurse or doctor.  Patient would like to know the status of the med. Request since the doctor will not be back until Monday.  Please advise and call patient to discuss at [phone]

## 2019-10-17 NOTE — Telephone Encounter (Signed)
Patient's wife calling back, she is requesting to speak with clinical staff, specefically wanting to speak with Chassidy.

## 2019-10-17 NOTE — Telephone Encounter (Signed)
Copied from CRM [id number]. Topic: Quick Communication - Rx Refill/Question >> Oct 16, 2019  4:53 PM Randol Kern wrote: Medication: Pt has a kidney stone, and is requesting if PCP can prescribe him the same antibiotic that helped him last time. Declined next available appt 10/24/2019   Has the patient contacted their pharmacy? No. (Agent: If no, request that the patient contact the pharmacy for the refill.) (Agent: If yes, when and what did the pharmacy advise?)  Preferred Pharmacy (with phone number or street name): [id number] - [place], [place] - [address] [place] [address] Phone: [phone] Fax: [phone]    Agent: Please be advised that RX refills may take up to 3 business days. We ask that you follow-up with your pharmacy. >> Oct 16, 2019  4:59 PM Floria Raveling A wrote: Pt called back to say there was 2 meds that he took last time . He would like to know if Flomax could be called in as well.

## 2019-10-21 ENCOUNTER — Telehealth: Payer: Self-pay | Admitting: Internal Medicine

## 2019-10-21 NOTE — Telephone Encounter (Signed)
Copied from CRM [id number]. Topic: General - Other >> Oct 21, 2019  9:13 AM Tamela Oddi wrote: Reason for CRM: Patient's wife would like the nurse to call her regarding patient.  Would not give details of nature of call.  CB# [phone]

## 2019-10-21 NOTE — Telephone Encounter (Signed)
Called and left a VM for patient wife informing that if the patient is still having issues the passing a stone then he needs to go to the ER per Dr Judithann Graves. She said there is nothing further she can do for the patient but the Er can assist with trying to help the patient at this point.  CM

## 2019-10-23 ENCOUNTER — Ambulatory Visit: Payer: Self-pay | Admitting: Internal Medicine

## 2020-01-20 ENCOUNTER — Ambulatory Visit: Payer: Self-pay | Admitting: Internal Medicine

## 2020-07-09 ENCOUNTER — Other Ambulatory Visit: Payer: Self-pay | Admitting: Internal Medicine

## 2020-07-09 DIAGNOSIS — I10 Essential (primary) hypertension: Secondary | ICD-10-CM

## 2020-07-20 NOTE — Progress Notes (Signed)
Date:  07/21/2020   Name:  Maurice Hill   DOB:  May 17, 1970   MRN:  [id number]   Chief Complaint: Annual Exam  Maurice Hill is a 50 y.o. male who presents today for his Complete Annual Exam. He feels well. He reports exercising - 4 to 5 miles of walking daily. He reports he is sleeping well.   Colonoscopy: none  Immunization History  Administered Date(s) Administered  . Hepatitis B, adult 09/17/2019  . PFIZER(Purple Top)SARS-COV-2 Vaccination 06/05/2019, 06/26/2019, 12/13/2019  . Tdap 03/15/2010    Hypertension This is a chronic problem. The problem is controlled. Associated symptoms include anxiety, chest pain and headaches (daily AM headaches relieved by [facility] powders). Pertinent negatives include no palpitations or shortness of breath. Past treatments include diuretics and calcium channel blockers. The current treatment provides significant improvement.  Anxiety Presents for follow-up visit. Symptoms include chest pain and nervous/anxious behavior. Patient reports no dizziness, palpitations or shortness of breath.    Chest Pain  This is a new problem. The problem occurs intermittently. The problem has been unchanged. The pain is present in the substernal region. The pain is mild. The quality of the pain is described as pressure. The pain does not radiate. Associated symptoms include headaches (daily AM headaches relieved by [facility] powders). Pertinent negatives include no abdominal pain, back pain, diaphoresis, dizziness, palpitations or shortness of breath. The pain is aggravated by emotional upset and exertion. He has tried nothing for the symptoms.  His past medical history is significant for hypertension.    Lab Results  Component Value Date   CREATININE 0.90 07/19/2019   BUN 16 07/19/2019   NA 144 07/19/2019   K 3.8 07/19/2019   CL 103 07/19/2019   CO2 25 07/19/2019   Lab Results  Component Value Date   CHOL 202 (H) 07/19/2019   HDL 47 07/19/2019    LDLCALC 121 (H) 07/19/2019   TRIG 196 (H) 07/19/2019   CHOLHDL 4.3 07/19/2019   Lab Results  Component Value Date   TSH 2.430 03/22/2017   No results found for: HGBA1C Lab Results  Component Value Date   WBC 6.6 04/27/2018   HGB 15.1 04/27/2018   HCT 44.3 04/27/2018   MCV 91 04/27/2018   PLT 251 04/27/2018   Lab Results  Component Value Date   ALT 21 07/19/2019   AST 28 07/19/2019   ALKPHOS 75 07/19/2019   BILITOT 0.2 07/19/2019     Review of Systems  Constitutional: Negative for appetite change, chills, diaphoresis, fatigue and unexpected weight change.  HENT: Negative for hearing loss, tinnitus, trouble swallowing and voice change.   Eyes: Negative for visual disturbance.  Respiratory: Negative for choking, shortness of breath and wheezing.   Cardiovascular: Positive for chest pain. Negative for palpitations and leg swelling.  Gastrointestinal: Negative for abdominal pain, blood in stool, constipation and diarrhea.  Genitourinary: Negative for difficulty urinating, dysuria, frequency, hematuria and urgency.  Musculoskeletal: Negative for arthralgias (general joint pains due to physical job duties), back pain and myalgias.  Skin: Negative for color change and rash.  Neurological: Positive for headaches (daily AM headaches relieved by [facility] powders). Negative for dizziness and syncope.  Hematological: Negative for adenopathy.  Psychiatric/Behavioral: Negative for dysphoric mood and sleep disturbance. The patient is nervous/anxious.     Patient Active Problem List   Diagnosis Date Noted  . Family hx of colon cancer 07/19/2019  . 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 Plantar fasciitis of left foot 09/02/2015  .  Anxiety disorder 07/03/2015  . Essential (primary) hypertension 01/28/2015  . Chronic headache 01/28/2015  . HLD (hyperlipidemia) 01/28/2015  . Snores 01/28/2015  . Compulsive tobacco user syndrome 01/28/2015    Allergies  Allergen Reactions  . Penicillins  Rash    Past Surgical History:  Procedure Laterality Date  . testicular cyst  2000    Social History   Tobacco Use  . Smoking status: Current Every Day Smoker    Packs/day: 1.00    Years: 30.00    Pack years: 30.00    Types: Cigarettes    Start date: 06/18/1988  . Smokeless tobacco: Former Neurosurgeon    Types: Chew    Quit date: 04/27/2008  Vaping Use  . Vaping Use: Never used  Substance Use Topics  . Alcohol use: Not Currently    Alcohol/week: 0.0 standard drinks    Comment: occasional  . Drug use: No     Medication list has been reviewed and updated.  Current Meds  Medication Sig  . ALPRAZolam (XANAX) 0.5 MG tablet Take 0.5 tablets (0.25 mg total) by mouth 2 (two) times daily as needed for anxiety.  Marland Kitchen amLODipine (NORVASC) 5 MG tablet Take 1 tablet (5 mg total) by mouth daily.  . hydrochlorothiazide (HYDRODIURIL) 25 MG tablet Take 1 tablet (25 mg total) by mouth every morning.    PHQ 2/9 Scores 07/21/2020 07/19/2019 04/27/2018 03/22/2017  PHQ - 2 Score 0 2 0 0  PHQ- 9 Score 1 2 - -    GAD 7 : Generalized Anxiety Score 07/21/2020 07/19/2019 03/22/2017  Nervous, Anxious, on Edge 2 0 0  Control/stop worrying 2 0 0  Worry too much - different things 3 0 0  Trouble relaxing 0 0 0  Restless 0 0 0  Easily annoyed or irritable 2 1 3   Afraid - awful might happen 0 0 0  Total GAD 7 Score 9 1 3   Anxiety Difficulty Not difficult at all Not difficult at all Somewhat difficult    BP Readings from Last 3 Encounters:  07/21/20 128/80  07/19/19 130/78  06/19/18 119/71    Physical Exam Vitals and nursing note reviewed.  Constitutional:      Appearance: Normal appearance. He is well-developed.  HENT:     Head: Normocephalic.     Right Ear: Tympanic membrane, ear canal and external ear normal.     Left Ear: Tympanic membrane, ear canal and external ear normal.     Nose: Nose normal.  Eyes:     Conjunctiva/sclera: Conjunctivae normal.     Pupils: Pupils are equal, round, and reactive  to light.  Neck:     Thyroid: No thyromegaly.     Vascular: No carotid bruit.  Cardiovascular:     Rate and Rhythm: Normal rate and regular rhythm.     Heart sounds: Normal heart sounds.  Pulmonary:     Effort: Pulmonary effort is normal.     Breath sounds: Normal breath sounds. No wheezing.  Chest:  Breasts:     Right: No mass.     Left: No mass.    Abdominal:     General: Bowel sounds are normal.     Palpations: Abdomen is soft.     Tenderness: There is no abdominal tenderness.  Musculoskeletal:        General: Normal range of motion.     Cervical back: Normal range of motion and neck supple.  Lymphadenopathy:     Cervical: No cervical adenopathy.  Skin:  General: Skin is warm and dry.  Neurological:     Mental Status: He is alert and oriented to person, place, and time.     Deep Tendon Reflexes: Reflexes are normal and symmetric.  Psychiatric:        Attention and Perception: Attention normal.        Mood and Affect: Mood normal.        Thought Content: Thought content normal.     Wt Readings from Last 3 Encounters:  07/21/20 236 lb (107 kg)  07/19/19 220 lb (99.8 kg)  06/19/18 212 lb (96.2 kg)    BP 128/80   Pulse 97   Ht 5\' 4"  (1.626 m)   Wt 236 lb (107 kg)   SpO2 99%   BMI 40.51 kg/m   Assessment and Plan: 1. Annual physical exam Exam is normal except for weight. Encourage regular exercise and appropriate dietary changes.  2. 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 Prostate cancer screening DRE deferred to lack of sx - PSA  4. Essential (primary) hypertension Clinically stable exam with well controlled BP. Tolerating medications without side effects at this time. Pt to continue current regimen and low sodium diet; benefits of regular exercise as able discussed. - CBC with Differential/Platelet -  Comprehensive metabolic panel - POCT urinalysis dipstick - hydrochlorothiazide (HYDRODIURIL) 25 MG tablet; Take 1 tablet (25 mg total) by mouth every morning.  Dispense: 90 tablet; Refill: 3 - amLODipine (NORVASC) 5 MG tablet; Take 1 tablet (5 mg total) by mouth daily.  Dispense: 90 tablet; Refill: 3  5. Mixed hyperlipidemia Check labs and advise Will likely need to start medication - Lipid panel  6. Generalized anxiety disorder - ALPRAZolam (XANAX) 0.5 MG tablet; Take 0.5 tablets (0.25 mg total) by mouth 2 (two) times daily as needed for anxiety.  Dispense: 30 tablet; Refill: 5   Partially dictated using [facility] Any errors are unintentional.  Animal nutritionist, MD [facility]  07/21/2020

## 2020-07-21 ENCOUNTER — Ambulatory Visit (INDEPENDENT_AMBULATORY_CARE_PROVIDER_SITE_OTHER): Payer: Self-pay | Admitting: Internal Medicine

## 2020-07-21 ENCOUNTER — Encounter: Payer: Self-pay | Admitting: Internal Medicine

## 2020-07-21 ENCOUNTER — Other Ambulatory Visit: Payer: Self-pay

## 2020-07-21 VITALS — BP 128/80 | HR 97 | Ht 64.0 in | Wt 236.0 lb

## 2020-07-21 DIAGNOSIS — R079 Chest pain, unspecified: Secondary | ICD-10-CM

## 2020-07-21 DIAGNOSIS — Z125 Encounter for screening for malignant neoplasm of prostate: Secondary | ICD-10-CM

## 2020-07-21 DIAGNOSIS — F411 Generalized anxiety disorder: Secondary | ICD-10-CM

## 2020-07-21 DIAGNOSIS — E782 Mixed hyperlipidemia: Secondary | ICD-10-CM

## 2020-07-21 DIAGNOSIS — Z0000 Encounter for general adult medical examination without abnormal findings: Secondary | ICD-10-CM

## 2020-07-21 DIAGNOSIS — I10 Essential (primary) hypertension: Secondary | ICD-10-CM

## 2020-07-21 LAB — POCT URINALYSIS DIPSTICK
Bilirubin, UA: NEGATIVE
Blood, UA: NEGATIVE
Glucose, UA: NEGATIVE
Ketones, UA: NEGATIVE
Leukocytes, UA: NEGATIVE
Nitrite, UA: NEGATIVE
Protein, UA: NEGATIVE
Spec Grav, UA: 1.02 (ref 1.010–1.025)
Urobilinogen, UA: 0.2 E.U./dL
pH, UA: 6 (ref 5.0–8.0)

## 2020-07-21 MED ORDER — HYDROCHLOROTHIAZIDE 25 MG PO TABS: 25.0000 mg | ORAL_TABLET | Freq: Every morning | ORAL | 3 refills | Status: DC

## 2020-07-21 MED ORDER — AMLODIPINE BESYLATE 5 MG PO TABS: 5.0000 mg | ORAL_TABLET | Freq: Every day | ORAL | 3 refills | Status: DC

## 2020-07-21 MED ORDER — ALPRAZOLAM 0.5 MG PO TABS: 0.2500 mg | ORAL_TABLET | Freq: Two times a day (BID) | ORAL | 5 refills | Status: DC | PRN

## 2020-07-22 ENCOUNTER — Ambulatory Visit: Payer: Self-pay | Admitting: *Deleted

## 2020-07-22 LAB — COMPREHENSIVE METABOLIC PANEL
ALT: 28 IU/L (ref 0–44)
AST: 27 IU/L (ref 0–40)
Albumin/Globulin Ratio: 1.8 (ref 1.2–2.2)
Albumin: 4.8 g/dL (ref 4.0–5.0)
Alkaline Phosphatase: 71 IU/L (ref 44–121)
BUN/Creatinine Ratio: 17 (ref 9–20)
BUN: 16 mg/dL (ref 6–24)
Bilirubin Total: 0.4 mg/dL (ref 0.0–1.2)
CO2: 27 mmol/L (ref 20–29)
Calcium: 9.8 mg/dL (ref 8.7–10.2)
Chloride: 100 mmol/L (ref 96–106)
Creatinine, Ser: 0.92 mg/dL (ref 0.76–1.27)
Globulin, Total: 2.6 g/dL (ref 1.5–4.5)
Glucose: 81 mg/dL (ref 65–99)
Potassium: 3.9 mmol/L (ref 3.5–5.2)
Sodium: 141 mmol/L (ref 134–144)
Total Protein: 7.4 g/dL (ref 6.0–8.5)
eGFR: 102 mL/min/{1.73_m2} (ref >59)

## 2020-07-22 LAB — CBC WITH DIFFERENTIAL/PLATELET
Basophils Absolute: 0 10*3/uL (ref 0.0–0.2)
Basos: 0 %
EOS (ABSOLUTE): 0.1 10*3/uL (ref 0.0–0.4)
Eos: 1 %
Hematocrit: 48.8 % (ref 37.5–51.0)
Hemoglobin: 16.7 g/dL (ref 13.0–17.7)
Immature Grans (Abs): 0 10*3/uL (ref 0.0–0.1)
Immature Granulocytes: 0 %
Lymphocytes Absolute: 2.5 10*3/uL (ref 0.7–3.1)
Lymphs: 36 %
MCH: 31 pg (ref 26.6–33.0)
MCHC: 34.2 g/dL (ref 31.5–35.7)
MCV: 91 fL (ref 79–97)
Monocytes Absolute: 0.6 10*3/uL (ref 0.1–0.9)
Monocytes: 9 %
Neutrophils Absolute: 3.5 10*3/uL (ref 1.4–7.0)
Neutrophils: 54 %
Platelets: 271 10*3/uL (ref 150–450)
RBC: 5.39 x10E6/uL (ref 4.14–5.80)
RDW: 13.7 % (ref 11.6–15.4)
WBC: 6.8 10*3/uL (ref 3.4–10.8)

## 2020-07-22 LAB — LIPID PANEL
Chol/HDL Ratio: 4.8 ratio (ref 0.0–5.0)
Cholesterol, Total: 219 mg/dL — ABNORMAL HIGH (ref 100–199)
HDL: 46 mg/dL (ref >39)
LDL Chol Calc (NIH): 146 mg/dL — ABNORMAL HIGH (ref 0–99)
Triglycerides: 148 mg/dL (ref 0–149)
VLDL Cholesterol Cal: 27 mg/dL (ref 5–40)

## 2020-07-22 LAB — PSA: Prostate Specific Ag, Serum: 0.8 ng/mL (ref 0.0–4.0)

## 2020-07-22 NOTE — Telephone Encounter (Signed)
Pt given lab results per notes of Dr. Judithann Graves on 07/22/20. Pt verbalized understanding. Patient reports he is willing to try medication to lower cholesterol if recommended. Patient has been managing diet and exercise.

## 2020-07-23 ENCOUNTER — Other Ambulatory Visit: Payer: Self-pay | Admitting: Internal Medicine

## 2020-07-23 DIAGNOSIS — E782 Mixed hyperlipidemia: Secondary | ICD-10-CM

## 2020-07-23 MED ORDER — ATORVASTATIN CALCIUM 10 MG PO TABS: 10.0000 mg | ORAL_TABLET | Freq: Every day | ORAL | 1 refills | Status: DC

## 2020-07-23 NOTE — Telephone Encounter (Signed)
Attached to lab result note. Sent to Dr. Judithann Graves to send in cholesterol meds for patient to start.

## 2020-08-17 DIAGNOSIS — I208 Other forms of angina pectoris: Secondary | ICD-10-CM | POA: Insufficient documentation

## 2020-08-17 DIAGNOSIS — Z8249 Family history of ischemic heart disease and other diseases of the circulatory system: Secondary | ICD-10-CM | POA: Insufficient documentation

## 2020-08-17 DIAGNOSIS — I2089 Other forms of angina pectoris: Secondary | ICD-10-CM | POA: Insufficient documentation

## 2020-10-15 ENCOUNTER — Other Ambulatory Visit: Payer: Self-pay | Admitting: Internal Medicine

## 2020-10-15 DIAGNOSIS — E782 Mixed hyperlipidemia: Secondary | ICD-10-CM

## 2020-10-18 ENCOUNTER — Other Ambulatory Visit: Payer: Self-pay | Admitting: Internal Medicine

## 2020-10-18 DIAGNOSIS — E782 Mixed hyperlipidemia: Secondary | ICD-10-CM

## 2020-10-18 NOTE — Telephone Encounter (Signed)
last RF 07/23/20 #90 1 RF

## 2020-12-22 ENCOUNTER — Telehealth: Payer: Self-pay | Admitting: Internal Medicine

## 2020-12-22 ENCOUNTER — Other Ambulatory Visit: Payer: Self-pay

## 2020-12-22 ENCOUNTER — Telehealth: Payer: Self-pay

## 2020-12-22 NOTE — Telephone Encounter (Signed)
Copied from CRM [id number]. Topic: General - Other >> Dec 22, 2020 12:41 PM Glean Salen wrote: Reason for VZC:HYIFOYD wants to cancel video appt for today. 10/11/

## 2020-12-22 NOTE — Telephone Encounter (Signed)
Canceled appt.

## 2021-01-04 ENCOUNTER — Other Ambulatory Visit: Payer: Self-pay | Admitting: Internal Medicine

## 2021-01-04 DIAGNOSIS — E782 Mixed hyperlipidemia: Secondary | ICD-10-CM

## 2021-01-04 NOTE — Telephone Encounter (Signed)
Requested Prescriptions  Pending Prescriptions Disp Refills  . atorvastatin (LIPITOR) 10 MG tablet [Pharmacy Med Name: Atorvastatin Calcium Oral Tablet 10 MG] 90 tablet 1    Sig: TAKE ONE TABLET BY MOUTH ONE TIME DAILY.     Cardiovascular:  Antilipid - Statins Failed - 01/04/2021  1:05 PM      Failed - Total Cholesterol in normal range and within 360 days    Cholesterol, Total  Date Value Ref Range Status  07/21/2020 219 (H) 100 - 199 mg/dL Final         Failed - LDL in normal range and within 360 days    LDL Chol Calc (NIH)  Date Value Ref Range Status  07/21/2020 146 (H) 0 - 99 mg/dL Final         Passed - HDL in normal range and within 360 days    HDL  Date Value Ref Range Status  07/21/2020 46 >39 mg/dL Final         Passed - Triglycerides in normal range and within 360 days    Triglycerides  Date Value Ref Range Status  07/21/2020 148 0 - 149 mg/dL Final         Passed - Patient is not pregnant      Passed - Valid encounter within last 12 months    Recent Outpatient Visits          5 months ago Annual physical exam   [facility] Reubin Milan, MD   1 year ago Essential (primary) hypertension   [facility] Reubin Milan, MD   2 years ago Hematuria, unspecified type   [facility] Reubin Milan, MD   2 years ago Annual physical exam   [facility] Reubin Milan, MD   3 years ago Annual physical exam   [facility] Reubin Milan, MD      Future Appointments            In 6 months Judithann Graves Nyoka Cowden, MD [facility], [facility]

## 2021-04-29 ENCOUNTER — Other Ambulatory Visit: Payer: Self-pay | Admitting: Internal Medicine

## 2021-04-29 DIAGNOSIS — E782 Mixed hyperlipidemia: Secondary | ICD-10-CM

## 2021-04-29 NOTE — Telephone Encounter (Signed)
Requested Prescriptions  Pending Prescriptions Disp Refills   atorvastatin (LIPITOR) 10 MG tablet [Pharmacy Med Name: Atorvastatin Calcium Oral Tablet 10 MG] 90 tablet 0    Sig: TAKE ONE TABLET BY MOUTH ONE TIME DAILY.     Cardiovascular:  Antilipid - Statins Failed - 04/29/2021  6:44 AM      Failed - Lipid Panel in normal range within the last 12 months    Cholesterol, Total  Date Value Ref Range Status  07/21/2020 219 (H) 100 - 199 mg/dL Final   LDL Chol Calc (NIH)  Date Value Ref Range Status  07/21/2020 146 (H) 0 - 99 mg/dL Final   HDL  Date Value Ref Range Status  07/21/2020 46 >39 mg/dL Final   Triglycerides  Date Value Ref Range Status  07/21/2020 148 0 - 149 mg/dL Final         Passed - Patient is not pregnant      Passed - Valid encounter within last 12 months    Recent Outpatient Visits          9 months ago Annual physical exam   [facility] Glean Hess, MD   1 year ago Essential (primary) hypertension   [facility] Glean Hess, MD   2 years ago Hematuria, unspecified type   [facility] Glean Hess, MD   3 years ago Annual physical exam   [facility] Glean Hess, MD   4 years ago Annual physical exam   [facility] Glean Hess, MD      Future Appointments            In 2 months Army Melia Jesse Sans, MD [facility], [facility]

## 2021-07-18 ENCOUNTER — Other Ambulatory Visit: Payer: Self-pay | Admitting: Internal Medicine

## 2021-07-18 DIAGNOSIS — E782 Mixed hyperlipidemia: Secondary | ICD-10-CM

## 2021-07-19 NOTE — Telephone Encounter (Signed)
Requested medication (s) are due for refill today: yes ? ?Requested medication (s) are on the active medication list: yes ? ?Last refill:  04/29/21 #90/0 ? ?Future visit scheduled: yes ? ?Notes to clinic:  pt is overdue for an appt but has 1 coming up on 07/23/21 ? ? ?  ?Requested Prescriptions  ?Pending Prescriptions Disp Refills  ? atorvastatin (LIPITOR) 10 MG tablet [Pharmacy Med Name: Atorvastatin Calcium Oral Tablet 10 MG] 90 tablet 0  ?  Sig: TAKE ONE TABLET BY MOUTH ONE TIME DAILY.  ?  ? Cardiovascular:  Antilipid - Statins Failed - 07/18/2021  8:43 AM  ?  ?  Failed - Valid encounter within last 12 months  ?  Recent Outpatient Visits   ? ?      ? 12 months ago Annual physical exam  ? [facility] Glean Hess, MD  ? 2 years ago Essential (primary) hypertension  ? [facility] Glean Hess, MD  ? 3 years ago Hematuria, unspecified type  ? [facility] Glean Hess, MD  ? 3 years ago Annual physical exam  ? [facility] Glean Hess, MD  ? 4 years ago Annual physical exam  ? [facility] Glean Hess, MD  ? ?  ?  ?Future Appointments   ? ?        ? In 4 days Glean Hess, MD [facility] [place]  ? ?  ? ? ?  ?  ?  Failed - Lipid Panel in normal range within the last 12 months  ?  Cholesterol, Total  ?Date Value Ref Range Status  ?07/21/2020 219 (H) 100 - 199 mg/dL Final  ? ?LDL Chol Calc (NIH)  ?Date Value Ref Range Status  ?07/21/2020 146 (H) 0 - 99 mg/dL Final  ? ?HDL  ?Date Value Ref Range Status  ?07/21/2020 46 >39 mg/dL Final  ? ?Triglycerides  ?Date Value Ref Range Status  ?07/21/2020 148 0 - 149 mg/dL Final  ? ?  ?  ?  Passed - Patient is not pregnant  ?  ?  ? ?

## 2021-07-21 ENCOUNTER — Other Ambulatory Visit: Payer: Self-pay | Admitting: Internal Medicine

## 2021-07-21 DIAGNOSIS — E782 Mixed hyperlipidemia: Secondary | ICD-10-CM

## 2021-07-22 NOTE — Telephone Encounter (Signed)
Receipt confirmed by pharmacy 07/19/21 at 2:55 ?Requested Prescriptions  ?Pending Prescriptions Disp Refills  ?? atorvastatin (LIPITOR) 10 MG tablet [Pharmacy Med Name: Atorvastatin Calcium Oral Tablet 10 MG] 90 tablet 0  ?  Sig: TAKE ONE TABLET BY MOUTH ONE TIME DAILY.  ?  ? Cardiovascular:  Antilipid - Statins Failed - 07/21/2021  5:48 PM  ?  ?  Failed - Valid encounter within last 12 months  ?  Recent Outpatient Visits   ?      ? 1 year ago Annual physical exam  ? [facility] Reubin Milan, MD  ? 2 years ago Essential (primary) hypertension  ? [facility] Reubin Milan, MD  ? 3 years ago Hematuria, unspecified type  ? [facility] Reubin Milan, MD  ? 3 years ago Annual physical exam  ? [facility] Reubin Milan, MD  ? 4 years ago Annual physical exam  ? [facility] Reubin Milan, MD  ?  ?  ?Future Appointments   ?        ? In 5 days Reubin Milan, MD [facility]  ?  ? ?  ?  ?  Failed - Lipid Panel in normal range within the last 12 months  ?  Cholesterol, Total  ?Date Value Ref Range Status  ?07/21/2020 219 (H) 100 - 199 mg/dL Final  ? ?LDL Chol Calc (NIH)  ?Date Value Ref Range Status  ?07/21/2020 146 (H) 0 - 99 mg/dL Final  ? ?HDL  ?Date Value Ref Range Status  ?07/21/2020 46 >39 mg/dL Final  ? ?Triglycerides  ?Date Value Ref Range Status  ?07/21/2020 148 0 - 149 mg/dL Final  ? ?  ?  ?  Passed - Patient is not pregnant  ?  ?  ? ? ?

## 2021-07-23 ENCOUNTER — Encounter: Payer: Self-pay | Admitting: Internal Medicine

## 2021-07-26 ENCOUNTER — Other Ambulatory Visit: Payer: Self-pay | Admitting: Internal Medicine

## 2021-07-26 DIAGNOSIS — I208 Other forms of angina pectoris: Secondary | ICD-10-CM

## 2021-07-27 ENCOUNTER — Encounter: Payer: Self-pay | Admitting: Internal Medicine

## 2021-07-27 ENCOUNTER — Ambulatory Visit (INDEPENDENT_AMBULATORY_CARE_PROVIDER_SITE_OTHER): Payer: Self-pay | Admitting: Internal Medicine

## 2021-07-27 VITALS — BP 128/70 | HR 64 | Ht 64.0 in | Wt 236.0 lb

## 2021-07-27 DIAGNOSIS — I208 Other forms of angina pectoris: Secondary | ICD-10-CM

## 2021-07-27 DIAGNOSIS — E782 Mixed hyperlipidemia: Secondary | ICD-10-CM

## 2021-07-27 DIAGNOSIS — I10 Essential (primary) hypertension: Secondary | ICD-10-CM

## 2021-07-27 DIAGNOSIS — Z125 Encounter for screening for malignant neoplasm of prostate: Secondary | ICD-10-CM

## 2021-07-27 DIAGNOSIS — Z1211 Encounter for screening for malignant neoplasm of colon: Secondary | ICD-10-CM

## 2021-07-27 DIAGNOSIS — Z0000 Encounter for general adult medical examination without abnormal findings: Secondary | ICD-10-CM

## 2021-07-27 DIAGNOSIS — F411 Generalized anxiety disorder: Secondary | ICD-10-CM

## 2021-07-27 LAB — POCT URINALYSIS DIPSTICK
Bilirubin, UA: NEGATIVE
Blood, UA: NEGATIVE
Glucose, UA: NEGATIVE
Ketones, UA: NEGATIVE
Leukocytes, UA: NEGATIVE
Nitrite, UA: NEGATIVE
Protein, UA: NEGATIVE
Spec Grav, UA: 1.01 (ref 1.010–1.025)
Urobilinogen, UA: 0.2 E.U./dL
pH, UA: 5 (ref 5.0–8.0)

## 2021-07-27 MED ORDER — HYDROCHLOROTHIAZIDE 25 MG PO TABS: 25.0000 mg | ORAL_TABLET | Freq: Every morning | ORAL | 3 refills | Status: DC

## 2021-07-27 MED ORDER — ATORVASTATIN CALCIUM 10 MG PO TABS: 10.0000 mg | ORAL_TABLET | Freq: Every day | ORAL | 1 refills | Status: DC

## 2021-07-27 MED ORDER — AMLODIPINE BESYLATE 5 MG PO TABS: 5.0000 mg | ORAL_TABLET | Freq: Every day | ORAL | 3 refills | Status: DC

## 2021-07-27 MED ORDER — ALPRAZOLAM 0.5 MG PO TABS: 0.2500 mg | ORAL_TABLET | Freq: Two times a day (BID) | ORAL | 5 refills | Status: DC | PRN

## 2021-07-27 MED ORDER — METOPROLOL SUCCINATE ER 25 MG PO TB24: 25.0000 mg | ORAL_TABLET | Freq: Every day | ORAL | 3 refills | Status: DC

## 2021-07-27 NOTE — Progress Notes (Signed)
? ? ?Date:  07/27/2021  ? ?Name:  Maurice Hill   DOB:  [birth year]   MRN:  [id number] ? ? ?Chief Complaint: Annual Exam ?Maurice Hill is a 51 y.o. male who presents today for his Complete Annual Exam. He feels well. He reports exercising. He reports he is sleeping well.  ? ?Colonoscopy: none ? ?Immunization History  ?Administered Date(s) Administered  ? Hepatitis B, adult 09/17/2019  ? PFIZER(Purple Top)SARS-COV-2 Vaccination 06/05/2019, 06/26/2019, 12/13/2019  ? Tdap 03/15/2010  ? ?Health Maintenance Due  ?Topic Date Due  ? Pneumococcal Vaccine 49-81 Years old (1 - PCV) Never done  ? HIV Screening  Never done  ? Hepatitis C Screening  Never done  ? COLONOSCOPY (Pts 45-5yr Insurance coverage will need to be confirmed)  Never done  ? TETANUS/TDAP  03/15/2020  ? Zoster Vaccines- Shingrix (1 of 2) Never done  ?  ?Lab Results  ?Component Value Date  ? PSA1 0.8 07/21/2020  ? ? ?Hypertension ?This is a chronic problem. The problem is controlled. Pertinent negatives include no chest pain, headaches, palpitations or shortness of breath. Past treatments include beta blockers, calcium channel blockers and diuretics. The current treatment provides significant improvement.  ?Hyperlipidemia ?This is a chronic problem. The problem is controlled. Pertinent negatives include no chest pain, myalgias or shortness of breath. Current antihyperlipidemic treatment includes statins. The current treatment provides significant improvement of lipids.  ?Angina - he was seen by cardiology in February for chest pain.  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  He has had no further chest pain, tolerated the medications well. ? ? ?Lab Results  ?Component Value Date  ? NA 141 07/21/2020  ? K 3.9 07/21/2020  ? CO2 27 07/21/2020  ? GLUCOSE 81 07/21/2020  ? BUN 16 07/21/2020  ? CREATININE 0.92 07/21/2020  ? CALCIUM 9.8 07/21/2020  ? EGFR 102 07/21/2020  ? GFRNONAA 101 07/19/2019   ? ?Lab Results  ?Component Value Date  ? CHOL 219 (H) 07/21/2020  ? HDL 46 07/21/2020  ? LDLCALC 146 (H) 07/21/2020  ? TRIG 148 07/21/2020  ? CHOLHDL 4.8 07/21/2020  ? ?Lab Results  ?Component Value Date  ? TSH 2.430 03/22/2017  ? ?No results found for: HGBA1C ?Lab Results  ?Component Value Date  ? WBC 6.8 07/21/2020  ? HGB 16.7 07/21/2020  ? HCT 48.8 07/21/2020  ? MCV 91 07/21/2020  ? PLT 271 07/21/2020  ? ?Lab Results  ?Component Value Date  ? ALT 28 07/21/2020  ? AST 27 07/21/2020  ? ALKPHOS 71 07/21/2020  ? BILITOT 0.4 07/21/2020  ? ?No results found for: 25OHVITD2, 2[place] VD25OH  ? ?Review of Systems  ?Constitutional:  Negative for appetite change, chills, diaphoresis, fatigue and unexpected weight change.  ?HENT:  Negative for hearing loss, tinnitus, trouble swallowing and voice change.   ?Eyes:  Negative for visual disturbance.  ?Respiratory:  Negative for choking, shortness of breath and wheezing.   ?Cardiovascular:  Negative for chest pain, palpitations and leg swelling.  ?Gastrointestinal:  Negative for abdominal pain, blood in stool, constipation and diarrhea.  ?Genitourinary:  Negative for difficulty urinating, dysuria and frequency.  ?Musculoskeletal:  Negative for arthralgias, back pain and myalgias.  ?Skin:  Negative for color change and rash.  ?Neurological:  Negative for dizziness, syncope and headaches.  ?Hematological:  Negative for adenopathy.  ?Psychiatric/Behavioral:  Negative for dysphoric mood and sleep disturbance. The patient is not nervous/anxious.   ? ?Patient Active Problem List  ?  Diagnosis Date Noted  ? Stable angina pectoris ([place]) 08/17/2020  ? Family history of premature CAD 08/17/2020  ? Family hx of colon cancer 07/19/2019  ? 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 Plantar fasciitis of left foot 09/02/2015  ? Anxiety disorder 07/03/2015  ? Essential (primary) hypertension 01/28/2015  ? HLD (hyperlipidemia) 01/28/2015  ? Snores 01/28/2015  ? Tobacco use disorder 01/28/2015   ? ? ?Allergies  ?Allergen Reactions  ? Penicillins Rash  ? ? ?Past Surgical History:  ?Procedure Laterality Date  ? testicular cyst  2000  ? ? ?Social History  ? ?Tobacco Use  ? Smoking status: Every Day  ?  Packs/day: 1.00  ?  Years: 30.00  ?  Pack years: 30.00  ?  Types: Cigarettes  ?  Start date: 06/18/1988  ? Smokeless tobacco: Former  ?  Types: Chew  ?  Quit date: 04/27/2008  ?Vaping Use  ? Vaping Use: Never used  ?Substance Use Topics  ? Alcohol use: Not Currently  ?  Alcohol/week: 0.0 standard drinks  ?  Comment: occasional  ? Drug use: No  ? ? ? ?Medication list has been reviewed and updated. ? ?Current Meds  ?Medication Sig  ? aspirin 81 MG EC tablet Take by mouth.  ? nitroGLYCERIN (NITROSTAT) 0.4 MG SL tablet Place under the tongue.  ? [DISCONTINUED] ALPRAZolam (XANAX) 0.5 MG tablet Take 0.5 tablets (0.25 mg total) by mouth 2 (two) times daily as needed for anxiety.  ? [DISCONTINUED] amLODipine (NORVASC) 5 MG tablet Take 1 tablet (5 mg total) by mouth daily.  ? [DISCONTINUED] atorvastatin (LIPITOR) 10 MG tablet TAKE ONE TABLET BY MOUTH ONE TIME DAILY.  ? [DISCONTINUED] hydrochlorothiazide (HYDRODIURIL) 25 MG tablet Take 1 tablet (25 mg total) by mouth every morning.  ? [DISCONTINUED] metoprolol succinate (TOPROL-XL) 25 MG 24 hr tablet Take 1 tablet by mouth daily.  ? ? ? ?  07/27/2021  ?  8:10 AM 07/21/2020  ? 10:23 AM 07/19/2019  ?  3:07 PM 03/22/2017  ? 10:28 AM  ?GAD 7 : Generalized Anxiety Score  ?Nervous, Anxious, on Edge 1 2 0 0  ?Control/stop worrying 1 2 0 0  ?Worry too much - different things 1 3 0 0  ?Trouble relaxing 0 0 0 0  ?Restless 0 0 0 0  ?Easily annoyed or irritable _0 ?Afraid - awful might happen 0 0 0 0  ?Total GAD 7 Score _1 ?Anxiety Difficulty Not difficult at all Not difficult at all Not difficult at all Somewhat difficult  ? ? ? ?  07/27/2021  ?  8:10 AM  ?Depression screen PHQ 2/9  ?Decreased Interest 0  ?Down, Depressed, Hopeless 0  ?PHQ - 2 Score 0  ?Altered sleeping 0   ?Tired, decreased energy 3  ?Change in appetite 0  ?Feeling bad or failure about yourself  0  ?Trouble concentrating 0  ?Moving slowly or fidgety/restless 0  ?Suicidal thoughts 0  ?PHQ-9 Score 3  ? ? ?BP Readings from Last 3 Encounters:  ?07/27/21 128/70  ?07/21/20 128/80  ?07/19/19 130/78  ? ? ?Physical Exam ?Vitals and nursing note reviewed.  ?Constitutional:   ?   Appearance: Normal appearance. He is well-developed.  ?HENT:  ?   Head: Normocephalic.  ?   Right Ear: Tympanic membrane, ear canal and external ear normal.  ?   Left Ear: Tympanic membrane, ear canal and external ear normal.  ?   Nose: Nose normal.  ?Eyes:  ?  Conjunctiva/sclera: Conjunctivae normal.  ?   Pupils: Pupils are equal, round, and reactive to light.  ?Neck:  ?   Thyroid: No thyromegaly.  ?   Vascular: No carotid bruit.  ?Cardiovascular:  ?   Rate and Rhythm: Normal rate and regular rhythm.  ?   Heart sounds: Normal heart sounds.  ?Pulmonary:  ?   Effort: Pulmonary effort is normal.  ?   Breath sounds: Normal breath sounds. No wheezing.  ?Chest:  ?Breasts: ?   Right: No mass.  ?   Left: No mass.  ?Abdominal:  ?   General: Bowel sounds are normal.  ?   Palpations: Abdomen is soft.  ?   Tenderness: There is no abdominal tenderness.  ?Musculoskeletal:     ?   General: Normal range of motion.  ?   Cervical back: Normal range of motion and neck supple.  ?   Right lower leg: No edema.  ?   Left lower leg: No edema.  ?Lymphadenopathy:  ?   Cervical: No cervical adenopathy.  ?Skin: ?   General: Skin is warm and dry.  ?   Capillary Refill: Capillary refill takes less than 2 seconds.  ?Neurological:  ?   General: No focal deficit present.  ?   Mental Status: He is alert and oriented to person, place, and time.  ?   Deep Tendon Reflexes: Reflexes are normal and symmetric.  ?Psychiatric:     ?   Attention and Perception: Attention normal.     ?   Mood and Affect: Mood normal.     ?   Thought Content: Thought content normal.  ? ? ?Wt Readings from Last  3 Encounters:  ?07/27/21 236 lb (107 kg)  ?07/21/20 236 lb (107 kg)  ?07/19/19 220 lb (99.8 kg)  ? ? ?BP 128/70   Pulse 64   Ht _0  (1.626 m)   Wt 236 lb (107 kg)   SpO2 98%   BMI 40.51 kg/m?  ? ?Assessment and Plan

## 2021-07-28 LAB — LIPID PANEL
Chol/HDL Ratio: 4.4 ratio (ref 0.0–5.0)
Cholesterol, Total: 194 mg/dL (ref 100–199)
HDL: 44 mg/dL (ref >39)
LDL Chol Calc (NIH): 134 mg/dL — ABNORMAL HIGH (ref 0–99)
Triglycerides: 85 mg/dL (ref 0–149)
VLDL Cholesterol Cal: 16 mg/dL (ref 5–40)

## 2021-07-28 LAB — CBC WITH DIFFERENTIAL/PLATELET
Basophils Absolute: 0 10*3/uL (ref 0.0–0.2)
Basos: 1 %
EOS (ABSOLUTE): 0.2 10*3/uL (ref 0.0–0.4)
Eos: 3 %
Hematocrit: 44.2 % (ref 37.5–51.0)
Hemoglobin: 15.2 g/dL (ref 13.0–17.7)
Immature Grans (Abs): 0 10*3/uL (ref 0.0–0.1)
Immature Granulocytes: 0 %
Lymphocytes Absolute: 2.4 10*3/uL (ref 0.7–3.1)
Lymphs: 35 %
MCH: 31.3 pg (ref 26.6–33.0)
MCHC: 34.4 g/dL (ref 31.5–35.7)
MCV: 91 fL (ref 79–97)
Monocytes Absolute: 0.7 10*3/uL (ref 0.1–0.9)
Monocytes: 10 %
Neutrophils Absolute: 3.4 10*3/uL (ref 1.4–7.0)
Neutrophils: 51 %
Platelets: 274 10*3/uL (ref 150–450)
RBC: 4.85 x10E6/uL (ref 4.14–5.80)
RDW: 13.5 % (ref 11.6–15.4)
WBC: 6.7 10*3/uL (ref 3.4–10.8)

## 2021-07-28 LAB — COMPREHENSIVE METABOLIC PANEL
ALT: 36 IU/L (ref 0–44)
AST: 36 IU/L (ref 0–40)
Albumin/Globulin Ratio: 2 (ref 1.2–2.2)
Albumin: 4.7 g/dL (ref 4.0–5.0)
Alkaline Phosphatase: 72 IU/L (ref 44–121)
BUN/Creatinine Ratio: 20 (ref 9–20)
BUN: 17 mg/dL (ref 6–24)
Bilirubin Total: 0.4 mg/dL (ref 0.0–1.2)
CO2: 23 mmol/L (ref 20–29)
Calcium: 9.6 mg/dL (ref 8.7–10.2)
Chloride: 103 mmol/L (ref 96–106)
Creatinine, Ser: 0.83 mg/dL (ref 0.76–1.27)
Globulin, Total: 2.3 g/dL (ref 1.5–4.5)
Glucose: 102 mg/dL — ABNORMAL HIGH (ref 70–99)
Potassium: 3.7 mmol/L (ref 3.5–5.2)
Sodium: 141 mmol/L (ref 134–144)
Total Protein: 7 g/dL (ref 6.0–8.5)
eGFR: 107 mL/min/{1.73_m2} (ref >59)

## 2021-07-28 LAB — PSA: Prostate Specific Ag, Serum: 0.6 ng/mL (ref 0.0–4.0)

## 2021-10-02 ENCOUNTER — Other Ambulatory Visit: Payer: Self-pay | Admitting: Internal Medicine

## 2021-10-02 DIAGNOSIS — I10 Essential (primary) hypertension: Secondary | ICD-10-CM

## 2021-10-04 NOTE — Telephone Encounter (Signed)
Requested medication (s) are due for refill today: no  Requested medication (s) are on the active medication list: yes  Last refill:  07/27/21  Future visit scheduled:yes  Notes to clinic:  Unable to refill per protocol, rx request is too soon. Last refill was 07/27/21 for 90 and 3 refills. Pt should have enough medication.     Requested Prescriptions  Pending Prescriptions Disp Refills   hydrochlorothiazide (HYDRODIURIL) 25 MG tablet [Pharmacy Med Name: HYDROCHLOROTHIAZIDE 25MG  TABS] 90 tablet 3    Sig: TAKE ONE TABLET BY MOUTH EVERY MORNING     Cardiovascular: Diuretics - Thiazide Passed - 10/02/2021  5:04 AM      Passed - Cr in normal range and within 180 days    Creatinine, Ser  Date Value Ref Range Status  07/27/2021 0.83 0.76 - 1.27 mg/dL Final         Passed - K in normal range and within 180 days    Potassium  Date Value Ref Range Status  07/27/2021 3.7 3.5 - 5.2 mmol/L Final         Passed - Na in normal range and within 180 days    Sodium  Date Value Ref Range Status  07/27/2021 141 134 - 144 mmol/L Final         Passed - Last BP in normal range    BP Readings from Last 1 Encounters:  07/27/21 128/70         Passed - Valid encounter within last 6 months    Recent Outpatient Visits           2 months ago Annual physical exam   [facility], MD   1 year ago Annual physical exam   [facility], MD   2 years ago Essential (primary) hypertension   [facility], MD   3 years ago Hematuria, unspecified type   [facility], MD   3 years ago Annual physical exam   [facility], MD       Future Appointments             In 3 months Reubin Milan Judithann Graves, MD [facility]   In 10 months [facility] Judithann Graves, MD [facility]             amLODipine (NORVASC) 5 MG tablet [Pharmacy Med Name: AMLODIPINE  BESYLATE 5MG  TABS] 90 tablet 3    Sig: TAKE ONE TABLET BY MOUTH ONE TIME DAILY.     Cardiovascular: Calcium Channel Blockers 2 Passed - 10/02/2021  5:04 AM      Passed - Last BP in normal range    BP Readings from Last 1 Encounters:  07/27/21 128/70         Passed - Last Heart Rate in normal range    Pulse Readings from Last 1 Encounters:  07/27/21 64         Passed - Valid encounter within last 6 months    Recent Outpatient Visits           2 months ago Annual physical exam   [facility] 07/29/21, MD   1 year ago Annual physical exam   [facility] Reubin Milan, MD   2 years ago Essential (primary) hypertension   [facility] Milan, MD   3 years ago Hematuria, unspecified type  [facility] Reubin Milan, MD   3 years ago Annual physical exam   [facility] Reubin Milan, MD       Future Appointments             In 3 months Judithann Graves Nyoka Cowden, MD [facility]   In 10 months Judithann Graves Nyoka Cowden, MD [facility], [facility]

## 2021-10-05 ENCOUNTER — Other Ambulatory Visit: Payer: Self-pay | Admitting: Internal Medicine

## 2021-10-05 DIAGNOSIS — I10 Essential (primary) hypertension: Secondary | ICD-10-CM

## 2021-10-07 NOTE — Telephone Encounter (Signed)
Both refilled 07/27/2021 #90 3 refills - [facility] [place], confirmed by pharmacy. Requested Prescriptions  Pending Prescriptions Disp Refills  . amLODipine (NORVASC) 5 MG tablet [Pharmacy Med Name: AMLODIPINE BESYLATE 5MG  TABS] 90 tablet 3    Sig: TAKE ONE TABLET BY MOUTH ONE TIME DAILY.     Cardiovascular: Calcium Channel Blockers 2 Passed - 10/05/2021  6:46 PM      Passed - Last BP in normal range    BP Readings from Last 1 Encounters:  07/27/21 128/70         Passed - Last Heart Rate in normal range    Pulse Readings from Last 1 Encounters:  07/27/21 64         Passed - Valid encounter within last 6 months    Recent Outpatient Visits          2 months ago Annual physical exam   [facility], MD   1 year ago Annual physical exam   [facility], MD   2 years ago Essential (primary) hypertension   [facility], MD   3 years ago Hematuria, unspecified type   [facility], MD   3 years ago Annual physical exam   [facility], MD      Future Appointments            In 3 months Reubin Milan, MD [facility]   In 9 months [facility] Judithann Graves, MD [facility]           . hydrochlorothiazide (HYDRODIURIL) 25 MG tablet [Pharmacy Med Name: HYDROCHLOROTHIAZIDE 25MG  TABS] 90 tablet 3    Sig: TAKE ONE TABLET BY MOUTH EVERY MORNING     Cardiovascular: Diuretics - Thiazide Passed - 10/05/2021  6:46 PM      Passed - Cr in normal range and within 180 days    Creatinine, Ser  Date Value Ref Range Status  07/27/2021 0.83 0.76 - 1.27 mg/dL Final         Passed - K in normal range and within 180 days    Potassium  Date Value Ref Range Status  07/27/2021 3.7 3.5 - 5.2 mmol/L Final         Passed - Na in normal range and within 180 days    Sodium  Date Value Ref Range Status  07/27/2021 141 134 - 144 mmol/L Final          Passed - Last BP in normal range    BP Readings from Last 1 Encounters:  07/27/21 128/70         Passed - Valid encounter within last 6 months    Recent Outpatient Visits          2 months ago Annual physical exam   [facility] 07/29/21, MD   1 year ago Annual physical exam   [facility] Reubin Milan, MD   2 years ago Essential (primary) hypertension   [facility] Reubin Milan, MD   3 years ago Hematuria, unspecified type   [facility] Reubin Milan, MD   3 years ago Annual physical exam   [facility] Reubin Milan, MD      Future Appointments            In 3 months [facility] Reubin Milan, MD [facility], [facility]  In 9 months Judithann Graves, Nyoka Cowden, MD [facility], [facility]

## 2021-10-08 NOTE — Telephone Encounter (Signed)
Disregard below  

## 2021-10-08 NOTE — Telephone Encounter (Deleted)
Pt's wife called reporting that he has run out of

## 2021-12-29 ENCOUNTER — Other Ambulatory Visit: Payer: Self-pay | Admitting: Internal Medicine

## 2021-12-29 DIAGNOSIS — E782 Mixed hyperlipidemia: Secondary | ICD-10-CM

## 2021-12-30 NOTE — Telephone Encounter (Signed)
Requested Prescriptions  Pending Prescriptions Disp Refills  . atorvastatin (LIPITOR) 10 MG tablet [Pharmacy Med Name: ATORVASTATIN CALCIUM 10MG  TABS] 90 tablet 1    Sig: TAKE ONE TABLET BY MOUTH ONE TIME DAILY.     Cardiovascular:  Antilipid - Statins Failed - 12/29/2021  6:13 PM      Failed - Lipid Panel in normal range within the last 12 months    Cholesterol, Total  Date Value Ref Range Status  07/27/2021 194 100 - 199 mg/dL Final   LDL Chol Calc (NIH)  Date Value Ref Range Status  07/27/2021 134 (H) 0 - 99 mg/dL Final   HDL  Date Value Ref Range Status  07/27/2021 44 >39 mg/dL Final   Triglycerides  Date Value Ref Range Status  07/27/2021 85 0 - 149 mg/dL Final         Passed - Patient is not pregnant      Passed - Valid encounter within last 12 months    Recent Outpatient Visits          5 months ago Annual physical exam   [place] [facility], Jesse Sans, MD   1 year ago Annual physical exam   [place] [facility], Jesse Sans, MD   2 years ago Essential (primary) hypertension   [place] [facility], Jesse Sans, MD   3 years ago Hematuria, unspecified type   [facility] at [facility], Jesse Sans, MD   3 years ago Annual physical exam   [facility] at [facility], Jesse Sans, MD      Future Appointments            In 4 weeks Army Melia Jesse Sans, MD [facility] at [facility], [facility]   In 7 months Army Melia, Jesse Sans, MD [place] [facility] at [facility], [facility]

## 2022-01-27 ENCOUNTER — Ambulatory Visit: Payer: Self-pay | Admitting: Internal Medicine

## 2022-01-27 NOTE — Progress Notes (Deleted)
Date:  01/27/2022   Name:  Maurice Hill   DOB:  1970/05/21   MRN:  [id number]   Chief Complaint: No chief complaint on file.  Hypertension This is a chronic problem. Pertinent negatives include no chest pain, headaches, palpitations or shortness of breath. Past treatments include calcium channel blockers, diuretics and beta blockers. The current treatment provides significant improvement. There is no history of kidney disease, CAD/MI or CVA.  Hyperlipidemia This is a chronic problem. The problem is controlled. Pertinent negatives include no chest pain or shortness of breath. Current antihyperlipidemic treatment includes statins. The current treatment provides moderate improvement of lipids.    Lab Results  Component Value Date   NA 141 07/27/2021   K 3.7 07/27/2021   CO2 23 07/27/2021   GLUCOSE 102 (H) 07/27/2021   BUN 17 07/27/2021   CREATININE 0.83 07/27/2021   CALCIUM 9.6 07/27/2021   EGFR 107 07/27/2021   GFRNONAA 101 07/19/2019   Lab Results  Component Value Date   CHOL 194 07/27/2021   HDL 44 07/27/2021   LDLCALC 134 (H) 07/27/2021   TRIG 85 07/27/2021   CHOLHDL 4.4 07/27/2021   Lab Results  Component Value Date   TSH 2.430 03/22/2017   No results found for: "HGBA1C" Lab Results  Component Value Date   WBC 6.7 07/27/2021   HGB 15.2 07/27/2021   HCT 44.2 07/27/2021   MCV 91 07/27/2021   PLT 274 07/27/2021   Lab Results  Component Value Date   ALT 36 07/27/2021   AST 36 07/27/2021   ALKPHOS 72 07/27/2021   BILITOT 0.4 07/27/2021   No results found for: "25OHVITD2", "25OHVITD3", "VD25OH"   Review of Systems  Constitutional:  Negative for fatigue and unexpected weight change.  HENT:  Negative for nosebleeds.   Eyes:  Negative for visual disturbance.  Respiratory:  Negative for cough, chest tightness, shortness of breath and wheezing.   Cardiovascular:  Negative for chest pain, palpitations and leg swelling.  Gastrointestinal:  Negative for  abdominal pain, constipation and diarrhea.  Neurological:  Negative for dizziness, weakness, light-headedness and headaches.    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   Plantar fasciitis of left foot 09/02/2015   Anxiety disorder 07/03/2015   Essential (primary) hypertension 01/28/2015   HLD (hyperlipidemia) 01/28/2015   Snores 01/28/2015   Tobacco use disorder 01/28/2015    Allergies  Allergen Reactions   Penicillins Rash    Past Surgical History:  Procedure Laterality Date   testicular cyst  2000    Social History   Tobacco Use   Smoking status: Every Day    Packs/day: 1.00    Years: 30.00    Total pack years: 30.00    Types: Cigarettes    Start date: 06/18/1988   Smokeless tobacco: Former    Types: Chew    Quit date: 04/27/2008  Vaping Use   Vaping Use: Never used  Substance Use Topics   Alcohol use: Not Currently    Alcohol/week: 0.0 standard drinks of alcohol    Comment: occasional   Drug use: No     Medication list has been reviewed and updated.  No outpatient medications have been marked as taking for the 01/27/22 encounter (Appointment) with Glean Hess, MD.       07/27/2021  8:10 AM 07/21/2020   10:23 AM 07/19/2019    3:07 PM 03/22/2017   10:28 AM  GAD 7 : Generalized Anxiety Score  Nervous, Anxious, on Edge 1 2 0 0  Control/stop worrying 1 2 0 0  Worry too much - different things 1 3 0 0  Trouble relaxing 0 0 0 0  Restless 0 0 0 0  Easily annoyed or irritable 1 2 1 3  Afraid - awful might happen 0 0 0 0  Total GAD 7 Score 4 9 1 3  Anxiety Difficulty Not difficult at all Not difficult at all Not difficult at all Somewhat difficult       07/27/2021    8:10 AM 07/21/2020   10:23 AM 07/19/2019    3:07 PM  Depression screen PHQ 2/9  Decreased Interest 0 0 1  Down,  Depressed, Hopeless 0 0 1  PHQ - 2 Score 0 0 2  Altered sleeping 0 0 0  Tired, decreased energy 3 0 0  Change in appetite 0 1 0  Feeling bad or failure about yourself  0 0 0  Trouble concentrating 0 0 0  Moving slowly or fidgety/restless 0 0 0  Suicidal thoughts 0 0 0  PHQ-9 Score 3 1 2  Difficult doing work/chores  Not difficult at all Not difficult at all    BP Readings from Last 3 Encounters:  07/27/21 128/70  07/21/20 128/80  07/19/19 130/78    Physical Exam Vitals and nursing note reviewed.  Constitutional:      General: He is not in acute distress.    Appearance: He is well-developed.  HENT:     Head: Normocephalic and atraumatic.  Pulmonary:     Effort: Pulmonary effort is normal. No respiratory distress.  Skin:    General: Skin is warm and dry.     Findings: No rash.  Neurological:     Mental Status: He is alert and oriented to person, place, and time.  Psychiatric:        Mood and Affect: Mood normal.        Behavior: Behavior normal.     Wt Readings from Last 3 Encounters:  07/27/21 236 lb (107 kg)  07/21/20 236 lb (107 kg)  07/19/19 220 lb (99.8 kg)    There were no vitals taken for this visit.  Assessment and Plan:     

## 2022-01-28 ENCOUNTER — Encounter: Payer: Self-pay | Admitting: Internal Medicine

## 2022-03-28 ENCOUNTER — Other Ambulatory Visit: Payer: Self-pay | Admitting: Internal Medicine

## 2022-03-28 DIAGNOSIS — E782 Mixed hyperlipidemia: Secondary | ICD-10-CM

## 2022-03-29 NOTE — Telephone Encounter (Signed)
Requested Prescriptions  Pending Prescriptions Disp Refills   atorvastatin (LIPITOR) 10 MG tablet [Pharmacy Med Name: ATORVASTATIN CALCIUM 10MG  TABS] 90 tablet 0    Sig: TAKE ONE TABLET BY MOUTH ONE TIME DAILY.     Cardiovascular:  Antilipid - Statins Failed - 03/28/2022  6:12 PM      Failed - Lipid Panel in normal range within the last 12 months    Cholesterol, Total  Date Value Ref Range Status  07/27/2021 194 100 - 199 mg/dL Final   LDL Chol Calc (NIH)  Date Value Ref Range Status  07/27/2021 134 (H) 0 - 99 mg/dL Final   HDL  Date Value Ref Range Status  07/27/2021 44 >39 mg/dL Final   Triglycerides  Date Value Ref Range Status  07/27/2021 85 0 - 149 mg/dL Final         Passed - Patient is not pregnant      Passed - Valid encounter within last 12 months    Recent Outpatient Visits           8 months ago Annual physical exam   [place] [facility], Jesse Sans, MD   1 year ago Annual physical exam   [place] [facility], Jesse Sans, MD   2 years ago Essential (primary) hypertension   [place] [facility], Jesse Sans, MD   3 years ago Hematuria, unspecified type   [facility] at [facility], Jesse Sans, MD   3 years ago Annual physical exam   [facility] at [facility], Jesse Sans, MD       Future Appointments             In 4 months Army Melia, Jesse Sans, MD [place] [facility] at [facility], [facility]

## 2022-06-25 ENCOUNTER — Other Ambulatory Visit: Payer: Self-pay | Admitting: Internal Medicine

## 2022-06-25 DIAGNOSIS — E782 Mixed hyperlipidemia: Secondary | ICD-10-CM

## 2022-06-27 NOTE — Telephone Encounter (Signed)
Requested Prescriptions  Pending Prescriptions Disp Refills   atorvastatin (LIPITOR) 10 MG tablet [Pharmacy Med Name: ATORVASTATIN CALCIUM 10MG  TABS] 90 tablet 0    Sig: TAKE ONE TABLET BY MOUTH EVERY DAY     Cardiovascular:  Antilipid - Statins Failed - 06/25/2022  5:05 AM      Failed - Lipid Panel in normal range within the last 12 months    Cholesterol, Total  Date Value Ref Range Status  07/27/2021 194 100 - 199 mg/dL Final   LDL Chol Calc (NIH)  Date Value Ref Range Status  07/27/2021 134 (H) 0 - 99 mg/dL Final   HDL  Date Value Ref Range Status  07/27/2021 44 >39 mg/dL Final   Triglycerides  Date Value Ref Range Status  07/27/2021 85 0 - 149 mg/dL Final         Passed - Patient is not pregnant      Passed - Valid encounter within last 12 months    Recent Outpatient Visits           11 months ago Annual physical exam   [place] [facility], Nyoka Cowden, MD   1 year ago Annual physical exam   [facility] at [facility], Nyoka Cowden, MD   2 years ago Essential (primary) hypertension   [place] [facility] at [facility], Nyoka Cowden, MD   4 years ago Hematuria, unspecified type   [facility] at [facility], Nyoka Cowden, MD   4 years ago Annual physical exam   [facility] at [facility], Nyoka Cowden, MD       Future Appointments             In 1 month Judithann Graves, Nyoka Cowden, MD [facility] at [facility], [facility]

## 2022-08-01 ENCOUNTER — Other Ambulatory Visit
Admission: RE | Admit: 2022-08-01 | Discharge: 2022-08-01 | Disposition: A | Discharge status: Home / Self Care | Payer: Self-pay | Attending: Internal Medicine | Admitting: Internal Medicine

## 2022-08-01 ENCOUNTER — Encounter: Payer: Self-pay | Admitting: Internal Medicine

## 2022-08-01 ENCOUNTER — Ambulatory Visit (INDEPENDENT_AMBULATORY_CARE_PROVIDER_SITE_OTHER): Payer: Self-pay | Admitting: Internal Medicine

## 2022-08-01 VITALS — BP 122/76 | HR 73 | Ht 71.0 in | Wt 222.0 lb

## 2022-08-01 DIAGNOSIS — Z1211 Encounter for screening for malignant neoplasm of colon: Secondary | ICD-10-CM

## 2022-08-01 DIAGNOSIS — Z125 Encounter for screening for malignant neoplasm of prostate: Secondary | ICD-10-CM | POA: Insufficient documentation

## 2022-08-01 DIAGNOSIS — E782 Mixed hyperlipidemia: Secondary | ICD-10-CM

## 2022-08-01 DIAGNOSIS — I2089 Other forms of angina pectoris: Secondary | ICD-10-CM

## 2022-08-01 DIAGNOSIS — I10 Essential (primary) hypertension: Secondary | ICD-10-CM | POA: Insufficient documentation

## 2022-08-01 DIAGNOSIS — F17200 Nicotine dependence, unspecified, uncomplicated: Secondary | ICD-10-CM

## 2022-08-01 DIAGNOSIS — Z0000 Encounter for general adult medical examination without abnormal findings: Secondary | ICD-10-CM

## 2022-08-01 LAB — CBC WITH DIFFERENTIAL/PLATELET
Abs Immature Granulocytes: 0.01 10*3/uL (ref 0.00–0.07)
Basophils Absolute: 0 10*3/uL (ref 0.0–0.1)
Basophils Relative: 1 %
Eosinophils Absolute: 0.1 10*3/uL (ref 0.0–0.5)
Eosinophils Relative: 2 %
HCT: 42 % (ref 39.0–52.0)
Hemoglobin: 15.1 g/dL (ref 13.0–17.0)
Immature Granulocytes: 0 %
Lymphocytes Relative: 32 %
Lymphs Abs: 2 10*3/uL (ref 0.7–4.0)
MCH: 31 pg (ref 26.0–34.0)
MCHC: 36 g/dL (ref 30.0–36.0)
MCV: 86.2 fL (ref 80.0–100.0)
Monocytes Absolute: 0.5 10*3/uL (ref 0.1–1.0)
Monocytes Relative: 8 %
Neutro Abs: 3.7 10*3/uL (ref 1.7–7.7)
Neutrophils Relative %: 57 %
Platelets: 252 10*3/uL (ref 150–400)
RBC: 4.87 MIL/uL (ref 4.22–5.81)
RDW: 13.5 % (ref 11.5–15.5)
WBC: 6.4 10*3/uL (ref 4.0–10.5)
nRBC: 0 % (ref 0.0–0.2)

## 2022-08-01 LAB — COMPREHENSIVE METABOLIC PANEL
ALT: 29 U/L (ref 0–44)
AST: 27 U/L (ref 15–41)
Albumin: 4.4 g/dL (ref 3.5–5.0)
Alkaline Phosphatase: 62 U/L (ref 38–126)
Anion gap: 7 (ref 5–15)
BUN: 20 mg/dL (ref 6–20)
CO2: 27 mmol/L (ref 22–32)
Calcium: 9.1 mg/dL (ref 8.9–10.3)
Chloride: 103 mmol/L (ref 98–111)
Creatinine, Ser: 0.87 mg/dL (ref 0.61–1.24)
GFR, Estimated: >60 mL/min (ref >60)
Glucose, Bld: 106 mg/dL — ABNORMAL HIGH (ref 70–99)
Potassium: 3.5 mmol/L (ref 3.5–5.1)
Sodium: 137 mmol/L (ref 135–145)
Total Bilirubin: 1 mg/dL (ref 0.3–1.2)
Total Protein: 7.5 g/dL (ref 6.5–8.1)

## 2022-08-01 LAB — PSA: Prostatic Specific Antigen: 0.55 ng/mL (ref 0.00–4.00)

## 2022-08-01 LAB — LIPID PANEL
Cholesterol: 147 mg/dL (ref 0–200)
HDL: 50 mg/dL (ref >40)
LDL Cholesterol: 77 mg/dL (ref 0–99)
Total CHOL/HDL Ratio: 2.9 RATIO
Triglycerides: 101 mg/dL (ref <150)
VLDL: 20 mg/dL (ref 0–40)

## 2022-08-01 MED ORDER — AMLODIPINE BESYLATE 5 MG PO TABS: 5.0000 mg | ORAL_TABLET | Freq: Every day | ORAL | 3 refills | Status: DC

## 2022-08-01 MED ORDER — HYDROCHLOROTHIAZIDE 25 MG PO TABS: 25.0000 mg | ORAL_TABLET | Freq: Every morning | ORAL | 3 refills | Status: DC

## 2022-08-01 MED ORDER — METOPROLOL SUCCINATE ER 25 MG PO TB24
25.0000 mg | ORAL_TABLET | Freq: Every day | ORAL | 3 refills | Status: DC
Start: 2022-08-01 — End: 2023-11-09

## 2022-08-01 NOTE — Assessment & Plan Note (Signed)
Quit smoking completely.

## 2022-08-01 NOTE — Assessment & Plan Note (Signed)
Tolerating statin medications.  No side effects noted. LDL is  Lab Results  Component Value Date   LDLCALC 134 (H) 07/27/2021  On atorvastatin 10 mg. Recommend increasing the dose to 20 mg.

## 2022-08-01 NOTE — Progress Notes (Signed)
Date:  08/01/2022   Name:  Maurice Hill   DOB:  1970/11/29   MRN:  [id number]   Chief Complaint: No chief complaint on file. Maurice Hill is a 53 y.o. male who presents today for his Complete Annual Exam. He feels well. He reports exercising doing heavy work. He reports he is sleeping fairly well. He quit smoking this February.    Colonoscopy: none  Immunization History  Administered Date(s) Administered   Hepatitis B, ADULT 09/17/2019   PFIZER(Purple Top)SARS-COV-2 Vaccination 06/05/2019, 06/26/2019, 12/13/2019   Tdap 03/15/2010   Health Maintenance Due  Topic Date Due   Pneumococcal Vaccine 18-32 Years old (1 of 2 - PCV) Never done   HIV Screening  Never done   Hepatitis C Screening  Never done   COLONOSCOPY (Pts 45-25yrs Insurance coverage will need to be confirmed)  Never done   DTaP/Tdap/Td (2 - Td or Tdap) 03/15/2020   Zoster Vaccines- Shingrix (1 of 2) Never done   COVID-19 Vaccine (4 - 2023-24 season) 11/12/2021    Lab Results  Component Value Date   PSA1 0.6 07/27/2021   PSA1 0.8 07/21/2020    Hypertension This is a chronic problem. The problem is controlled. Pertinent negatives include no chest pain, headaches, palpitations or shortness of breath. Past treatments include beta blockers, calcium channel blockers and diuretics. Hypertensive end-organ damage includes CAD/MI. There is no history of kidney disease or CVA.  Hyperlipidemia This is a chronic problem. The problem is uncontrolled. Recent lipid tests were reviewed and are high. Pertinent negatives include no chest pain, myalgias or shortness of breath. Current antihyperlipidemic treatment includes statins. The current treatment provides moderate improvement of lipids.    Lab Results  Component Value Date   NA 141 07/27/2021   K 3.7 07/27/2021   CO2 23 07/27/2021   GLUCOSE 102 (H) 07/27/2021   BUN 17 07/27/2021   CREATININE 0.83 07/27/2021   CALCIUM 9.6 07/27/2021   EGFR 107  07/27/2021   GFRNONAA 101 07/19/2019   Lab Results  Component Value Date   CHOL 194 07/27/2021   HDL 44 07/27/2021   LDLCALC 134 (H) 07/27/2021   TRIG 85 07/27/2021   CHOLHDL 4.4 07/27/2021   Lab Results  Component Value Date   TSH 2.430 03/22/2017   No results found for: "HGBA1C" Lab Results  Component Value Date   WBC 6.7 07/27/2021   HGB 15.2 07/27/2021   HCT 44.2 07/27/2021   MCV 91 07/27/2021   PLT 274 07/27/2021   Lab Results  Component Value Date   ALT 36 07/27/2021   AST 36 07/27/2021   ALKPHOS 72 07/27/2021   BILITOT 0.4 07/27/2021   No results found for: "25OHVITD2", "25OHVITD3", "VD25OH"   Review of Systems  Constitutional:  Negative for appetite change, chills, diaphoresis, fatigue and unexpected weight change.  HENT:  Negative for hearing loss, tinnitus, trouble swallowing and voice change.   Eyes:  Negative for visual disturbance.  Respiratory:  Negative for choking, shortness of breath and wheezing.   Cardiovascular:  Negative for chest pain, palpitations and leg swelling.  Gastrointestinal:  Negative for abdominal pain, blood in stool, constipation and diarrhea.  Genitourinary:  Negative for difficulty urinating, dysuria and frequency.  Musculoskeletal:  Negative for arthralgias, back pain and myalgias.  Skin:  Negative for color change and rash.  Neurological:  Negative for dizziness, syncope and headaches.  Hematological:  Negative for adenopathy.  Psychiatric/Behavioral:  Negative for dysphoric mood and sleep disturbance. The patient  is not nervous/anxious.     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   Plantar fasciitis of left foot 09/02/2015   Generalized anxiety disorder 07/03/2015   Essential (primary) hypertension 01/28/2015   Mixed hyperlipidemia 01/28/2015   Snores  01/28/2015   Tobacco use disorder 01/28/2015    Allergies  Allergen Reactions   Penicillins Rash    Past Surgical History:  Procedure Laterality Date   testicular cyst  03/14/1998    Social History   Tobacco Use   Smoking status: Every Day    Types: E-cigarettes   Smokeless tobacco: Former    Types: Chew    Quit date: 04/28/2022  Vaping Use   Vaping Use: Never used  Substance Use Topics   Alcohol use: Not Currently    Comment: occasional   Drug use: No     Medication list has been reviewed and updated.  Current Meds  Medication Sig   atorvastatin (LIPITOR) 10 MG tablet TAKE ONE TABLET BY MOUTH EVERY DAY   [DISCONTINUED] amLODipine (NORVASC) 5 MG tablet Take 1 tablet (5 mg total) by mouth daily.   [DISCONTINUED] hydrochlorothiazide (HYDRODIURIL) 25 MG tablet Take 1 tablet (25 mg total) by mouth every morning.   [DISCONTINUED] metoprolol succinate (TOPROL-XL) 25 MG 24 hr tablet Take 1 tablet (25 mg total) by mouth daily.       08/01/2022    7:54 AM 07/27/2021    8:10 AM 07/21/2020   10:23 AM 07/19/2019    3:07 PM  GAD 7 : Generalized Anxiety Score  Nervous, Anxious, on Edge 1 1 2  0  Control/stop worrying 1 1 2  0  Worry too much - different things 1 1 3  0  Trouble relaxing 0 0 0 0  Restless 0 0 0 0  Easily annoyed or irritable 0 1 2 1   Afraid - awful might happen 0 0 0 0  Total GAD 7 Score 3 4 9 1   Anxiety Difficulty Not difficult at all Not difficult at all Not difficult at all Not difficult at all       08/01/2022    7:54 AM 07/27/2021    8:10 AM 07/21/2020   10:23 AM  Depression screen PHQ 2/9  Decreased Interest 0 0 0  Down, Depressed, Hopeless 0 0 0  PHQ - 2 Score 0 0 0  Altered sleeping 0 0 0  Tired, decreased energy 0 3 0  Change in appetite 0 0 1  Feeling bad or failure about yourself  0 0 0  Trouble concentrating 0 0 0  Moving slowly or fidgety/restless 0 0 0  Suicidal thoughts 0 0 0  PHQ-9 Score 0 3 1  Difficult doing work/chores Not difficult  at all  Not difficult at all    BP Readings from Last 3 Encounters:  08/01/22 122/76  07/27/21 128/70  07/21/20 128/80    Physical Exam Vitals and nursing note reviewed.  Constitutional:      Appearance: Normal appearance. He is well-developed.  HENT:     Head: Normocephalic.     Right Ear: Tympanic membrane, ear canal and external ear normal.     Left Ear: Tympanic membrane, ear canal and external ear normal.     Nose: Nose normal.  Eyes:     Conjunctiva/sclera: Conjunctivae normal.     Pupils: Pupils  are equal, round, and reactive to light.  Neck:     Thyroid: No thyromegaly.     Vascular: No carotid bruit.  Cardiovascular:     Rate and Rhythm: Normal rate and regular rhythm.     Pulses: Normal pulses.     Heart sounds: Normal heart sounds.  Pulmonary:     Effort: Pulmonary effort is normal.     Breath sounds: Normal breath sounds. No wheezing.  Chest:  Breasts:    Right: No mass.     Left: No mass.  Abdominal:     General: Bowel sounds are normal.     Palpations: Abdomen is soft.     Tenderness: There is no abdominal tenderness.  Musculoskeletal:        General: Normal range of motion.     Cervical back: Normal range of motion and neck supple.  Lymphadenopathy:     Cervical: No cervical adenopathy.  Skin:    General: Skin is warm and dry.     Capillary Refill: Capillary refill takes less than 2 seconds.  Neurological:     Mental Status: He is alert and oriented to person, place, and time.     Deep Tendon Reflexes: Reflexes are normal and symmetric.  Psychiatric:        Attention and Perception: Attention normal.        Mood and Affect: Mood normal.        Thought Content: Thought content normal.     Wt Readings from Last 3 Encounters:  08/01/22 222 lb (100.7 kg)  07/27/21 236 lb (107 kg)  07/21/20 236 lb (107 kg)    BP 122/76   Pulse 73   Ht 5\' 11"  (1.803 m)   Wt 222 lb (100.7 kg)   SpO2 99%   BMI 30.96 kg/m   Assessment and Plan:  Problem  List Items Addressed This Visit     Essential (primary) hypertension (Chronic)    Stable exam with well controlled BP.  Currently taking metoprolol, amlodipine and hctz. Tolerating medications without concerns or side effects. Will continue to recommend low sodium diet and current regimen.       Relevant Medications   amLODipine (NORVASC) 5 MG tablet   hydrochlorothiazide (HYDRODIURIL) 25 MG tablet   metoprolol succinate (TOPROL-XL) 25 MG 24 hr tablet   Other Relevant Orders   CBC with Differential/Platelet   Comprehensive metabolic panel   Mixed hyperlipidemia    Tolerating statin medications.  No side effects noted. LDL is  Lab Results  Component Value Date   LDLCALC 134 (H) 07/27/2021  On atorvastatin 10 mg. Recommend increasing the dose to 20 mg.       Relevant Medications   amLODipine (NORVASC) 5 MG tablet   hydrochlorothiazide (HYDRODIURIL) 25 MG tablet   metoprolol succinate (TOPROL-XL) 25 MG 24 hr tablet   Other Relevant Orders   Lipid panel   Tobacco use disorder (Chronic)    Quit smoking completely      Stable angina pectoris (Chronic)    No recent chest pain or shortness of breath Continue BB, statin and PRN NTG      Relevant Medications   amLODipine (NORVASC) 5 MG tablet   hydrochlorothiazide (HYDRODIURIL) 25 MG tablet   metoprolol succinate (TOPROL-XL) 25 MG 24 hr tablet   Other Visit Diagnoses     Annual physical exam    -  Primary   Normal exam declines vaccines due to cost agrees to FIT testing   Colon cancer screening  Relevant Orders   Fecal occult blood, imunochemical   Prostate cancer screening       Relevant Orders   PSA       No follow-ups on file.   Partially dictated using Dragon software, any errors are not intentional.  Reubin Milan, MD [facility] [place], [place]

## 2022-08-01 NOTE — Assessment & Plan Note (Signed)
No recent chest pain or shortness of breath Continue BB, statin and PRN NTG

## 2022-08-01 NOTE — Assessment & Plan Note (Deleted)
Uses xanax 0.5 mg daily PRN

## 2022-08-01 NOTE — Assessment & Plan Note (Signed)
Stable exam with well controlled BP.  Currently taking metoprolol, amlodipine and hctz. Tolerating medications without concerns or side effects. Will continue to recommend low sodium diet and current regimen.

## 2022-08-02 NOTE — Progress Notes (Signed)
LVM to call for results. CRM created PEC may give results

## 2022-08-07 LAB — FECAL OCCULT BLOOD, IMMUNOCHEMICAL: Fecal Occult Bld: POSITIVE — AB

## 2022-08-12 ENCOUNTER — Telehealth: Payer: Self-pay | Admitting: *Deleted

## 2022-08-12 ENCOUNTER — Other Ambulatory Visit: Payer: Self-pay

## 2022-08-12 DIAGNOSIS — Z1211 Encounter for screening for malignant neoplasm of colon: Secondary | ICD-10-CM

## 2022-08-12 DIAGNOSIS — R195 Other fecal abnormalities: Secondary | ICD-10-CM

## 2022-08-12 NOTE — Telephone Encounter (Signed)
Patient is returning call/letter response. Notified of lab results and provider recommendations:   Cholesterol is excellent.  All other labs are normal. Stool test is positive for blood.  Next step is a colonoscopy.  Patient would like to move forward with referral to GI

## 2022-08-12 NOTE — Telephone Encounter (Signed)
Referral placed to [facility] for positive FIT/ screening for colon cancer.  - Maurice Hill

## 2022-08-22 ENCOUNTER — Encounter: Payer: Self-pay | Admitting: *Deleted

## 2022-09-20 ENCOUNTER — Other Ambulatory Visit: Payer: Self-pay | Admitting: Internal Medicine

## 2022-09-20 DIAGNOSIS — E782 Mixed hyperlipidemia: Secondary | ICD-10-CM

## 2022-09-21 NOTE — Telephone Encounter (Signed)
Requested Prescriptions  Pending Prescriptions Disp Refills   atorvastatin (LIPITOR) 10 MG tablet [Pharmacy Med Name: ATORVASTATIN CALCIUM 10MG  TABS] 90 tablet 2    Sig: TAKE ONE TABLET BY MOUTH EVERY DAY     Cardiovascular:  Antilipid - Statins Failed - 09/20/2022  5:56 PM      Failed - Lipid Panel in normal range within the last 12 months    Cholesterol, Total  Date Value Ref Range Status  07/27/2021 194 100 - 199 mg/dL Final   Cholesterol  Date Value Ref Range Status  08/01/2022 147 0 - 200 mg/dL Final   LDL Chol Calc (NIH)  Date Value Ref Range Status  07/27/2021 134 (H) 0 - 99 mg/dL Final   LDL Cholesterol  Date Value Ref Range Status  08/01/2022 77 0 - 99 mg/dL Final    Comment:           Total Cholesterol/HDL:CHD Risk Coronary Heart Disease Risk Table                     Men   Women  1/2 Average Risk   3.4   3.3  Average Risk       5.0   4.4  2 X Average Risk   9.6   7.1  3 X Average Risk  23.4   11.0        Use the calculated Patient Ratio above and the CHD Risk Table to determine the patient's CHD Risk.        ATP III CLASSIFICATION (LDL):  <100     mg/dL   Optimal  161-096  mg/dL   Near or Above                    Optimal  130-159  mg/dL   Borderline  045-409  mg/dL   High  >811     mg/dL   Very High Performed at [facility], [address] [place], [place] [address]    HDL  Date Value Ref Range Status  08/01/2022 50 >40 mg/dL Final  29/56/2130 44 >86 mg/dL Final   Triglycerides  Date Value Ref Range Status  08/01/2022 101 <150 mg/dL Final         Passed - Patient is not pregnant      Passed - Valid encounter within last 12 months    Recent Outpatient Visits           1 month ago Annual physical exam   [place] [facility] at [facility], Nyoka Cowden, MD   1 year ago Annual physical exam   [facility] at [facility], Nyoka Cowden, MD   2 years ago Annual  physical exam   [facility] at [facility], Nyoka Cowden, MD   3 years ago Essential (primary) hypertension   [place] [facility] at [facility], Nyoka Cowden, MD   4 years ago Hematuria, unspecified type   [facility] at [facility], Nyoka Cowden, MD       Future Appointments             In 10 months Judithann Graves Nyoka Cowden, MD [facility] at [facility]

## 2023-06-23 ENCOUNTER — Other Ambulatory Visit: Payer: Self-pay | Admitting: Internal Medicine

## 2023-06-23 DIAGNOSIS — E782 Mixed hyperlipidemia: Secondary | ICD-10-CM

## 2023-06-26 NOTE — Telephone Encounter (Signed)
 Requested Prescriptions  Pending Prescriptions Disp Refills   atorvastatin (LIPITOR) 10 MG tablet [Pharmacy Med Name: ATORVASTATIN CALCIUM 10MG  TABS] 90 tablet 0    Sig: TAKE ONE TABLET BY MOUTH EVERY DAY     Cardiovascular:  Antilipid - Statins Failed - 06/26/2023 10:27 AM      Failed - Valid encounter within last 12 months    Recent Outpatient Visits   None     Future Appointments             In 1 month Gala Jubilee, [place], MD [facility] at [facility], [place]            Failed - Lipid Panel in normal range within the last 12 months    Cholesterol, Total  Date Value Ref Range Status  07/27/2021 194 100 - 199 mg/dL Final   Cholesterol  Date Value Ref Range Status  08/01/2022 147 0 - 200 mg/dL Final   LDL Chol Calc (NIH)  Date Value Ref Range Status  07/27/2021 134 (H) 0 - 99 mg/dL Final   LDL Cholesterol  Date Value Ref Range Status  08/01/2022 77 0 - 99 mg/dL Final    Comment:           Total Cholesterol/HDL:CHD Risk Coronary Heart Disease Risk Table                     Men   Women  1/2 Average Risk   3.4   3.3  Average Risk       5.0   4.4  2 X Average Risk   9.6   7.1  3 X Average Risk  23.4   11.0        Use the calculated Patient Ratio above and the CHD Risk Table to determine the patient's CHD Risk.        ATP III CLASSIFICATION (LDL):  <100     mg/dL   Optimal  161-096  mg/dL   Near or Above                    Optimal  130-159  mg/dL   Borderline  045-409  mg/dL   High  >811     mg/dL   Very High Performed at [facility], [address] [place], [place] [address]    HDL  Date Value Ref Range Status  08/01/2022 50 >40 mg/dL Final  29/56/2130 44 >86 mg/dL Final   Triglycerides  Date Value Ref Range Status  08/01/2022 101 <150 mg/dL Final         Passed - Patient is not pregnant

## 2023-08-03 ENCOUNTER — Encounter: Payer: Self-pay | Admitting: Internal Medicine

## 2023-08-04 ENCOUNTER — Encounter: Payer: Self-pay | Admitting: Internal Medicine

## 2023-08-04 ENCOUNTER — Ambulatory Visit (INDEPENDENT_AMBULATORY_CARE_PROVIDER_SITE_OTHER): Payer: Self-pay | Admitting: Internal Medicine

## 2023-08-04 VITALS — BP 138/82 | HR 87 | Ht 71.0 in | Wt 236.2 lb

## 2023-08-04 DIAGNOSIS — Z1159 Encounter for screening for other viral diseases: Secondary | ICD-10-CM

## 2023-08-04 DIAGNOSIS — G8929 Other chronic pain: Secondary | ICD-10-CM

## 2023-08-04 DIAGNOSIS — Z8249 Family history of ischemic heart disease and other diseases of the circulatory system: Secondary | ICD-10-CM

## 2023-08-04 DIAGNOSIS — M25561 Pain in right knee: Secondary | ICD-10-CM

## 2023-08-04 DIAGNOSIS — Z125 Encounter for screening for malignant neoplasm of prostate: Secondary | ICD-10-CM

## 2023-08-04 DIAGNOSIS — I2089 Other forms of angina pectoris: Secondary | ICD-10-CM

## 2023-08-04 DIAGNOSIS — E782 Mixed hyperlipidemia: Secondary | ICD-10-CM

## 2023-08-04 DIAGNOSIS — I10 Essential (primary) hypertension: Secondary | ICD-10-CM

## 2023-08-04 DIAGNOSIS — Z0000 Encounter for general adult medical examination without abnormal findings: Secondary | ICD-10-CM

## 2023-08-04 DIAGNOSIS — Z1211 Encounter for screening for malignant neoplasm of colon: Secondary | ICD-10-CM

## 2023-08-04 DIAGNOSIS — Z800 Family history of malignant neoplasm of digestive organs: Secondary | ICD-10-CM

## 2023-08-04 NOTE — Assessment & Plan Note (Signed)
 LDL is  Lab Results  Component Value Date   LDLCALC 77 08/01/2022   Current regimen is atorvastatin .  No medication side effects noted. Goal LDL is <55.

## 2023-08-04 NOTE — Progress Notes (Signed)
 Date:  08/04/2023   Name:  Maurice Hill   DOB:  09/09/1970   MRN:  [id number]   Chief Complaint: Annual Exam Maurice Hill is a 53 y.o. male who presents today for his Complete Annual Exam. He feels well. He reports exercising none. He reports he is sleeping well.   Health Maintenance  Topic Date Due   HIV Screening  Never done   Hepatitis C Screening  Never done   Pneumococcal Vaccination (1 of 2 - PCV) Never done   Colon Cancer Screening  Never done   DTaP/Tdap/Td vaccine (2 - Td or Tdap) 03/15/2020   Zoster (Shingles) Vaccine (1 of 2) Never done   Stool Blood Test  08/01/2023   COVID-19 Vaccine (4 - 2024-25 season) 08/19/2024*   Flu Shot  10/13/2023   HPV Vaccine  Aged Out   Meningitis B Vaccine  Aged Out  *Topic was postponed. The date shown is not the original due date.    Lab Results  Component Value Date   PSA1 0.6 07/27/2021   PSA1 0.8 07/21/2020    Hypertension This is a chronic problem. The problem is controlled. Pertinent negatives include no chest pain, headaches, palpitations or shortness of breath. Past treatments include calcium  channel blockers, beta blockers and diuretics. The current treatment provides significant improvement. Hypertensive end-organ damage includes CAD/MI. There is no history of kidney disease or CVA.  Hyperlipidemia This is a chronic problem. The problem is controlled. Pertinent negatives include no chest pain or shortness of breath. Current antihyperlipidemic treatment includes statins.  Knee Pain  The incident occurred at work. The injury mechanism was a direct blow. The pain is present in the right knee. The quality of the pain is described as aching. The pain is moderate. The pain has been Fluctuating since onset. He reports no foreign bodies present.    Review of Systems  Constitutional:  Negative for fatigue and unexpected weight change.  HENT:  Negative for nosebleeds.   Eyes:  Negative for visual disturbance.   Respiratory:  Negative for cough, chest tightness, shortness of breath and wheezing.   Cardiovascular:  Negative for chest pain, palpitations and leg swelling.  Gastrointestinal:  Negative for abdominal pain, constipation and diarrhea.  Genitourinary:  Negative for dysuria and urgency.  Musculoskeletal:  Positive for arthralgias.  Skin:  Negative for color change and wound.  Neurological:  Negative for dizziness, weakness, light-headedness and headaches.  Psychiatric/Behavioral:  Negative for dysphoric mood and sleep disturbance. The patient is not nervous/anxious.      Lab Results  Component Value Date   NA 137 08/01/2022   K 3.5 08/01/2022   CO2 27 08/01/2022   GLUCOSE 106 (H) 08/01/2022   BUN 20 08/01/2022   CREATININE 0.87 08/01/2022   CALCIUM  9.1 08/01/2022   EGFR 107 07/27/2021   GFRNONAA >60 08/01/2022   Lab Results  Component Value Date   CHOL 147 08/01/2022   HDL 50 08/01/2022   LDLCALC 77 08/01/2022   TRIG 101 08/01/2022   CHOLHDL 2.9 08/01/2022   Lab Results  Component Value Date   TSH 2.430 03/22/2017   No results found for: "HGBA1C" Lab Results  Component Value Date   WBC 6.4 08/01/2022   HGB 15.1 08/01/2022   HCT 42.0 08/01/2022   MCV 86.2 08/01/2022   PLT 252 08/01/2022   Lab Results  Component Value Date   ALT 29 08/01/2022   AST 27 08/01/2022   ALKPHOS 62 08/01/2022   BILITOT  1.0 08/01/2022   No results found for: "25OHVITD2", "25OHVITD3", "VD25OH"   Patient Active Problem List   Diagnosis Date Noted   Stable angina pectoris (HCC) 08/17/2020   Family history of premature CAD 08/17/2020   Family hx of colon cancer 07/19/2019   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   Generalized anxiety disorder 07/03/2015   Essential (primary) hypertension 01/28/2015   Mixed hyperlipidemia 01/28/2015   Snores 01/28/2015   Tobacco use disorder 01/28/2015    Allergies  Allergen Reactions   Penicillins Rash    Past Surgical History:  Procedure  Laterality Date   testicular cyst  03/14/1998    Social History   Tobacco Use   Smoking status: Every Day    Types: E-cigarettes   Smokeless tobacco: Former    Types: Chew    Quit date: 04/28/2022  Vaping Use   Vaping status: Never Used  Substance Use Topics   Alcohol use: Not Currently    Comment: occasional   Drug use: No     Medication list has been reviewed and updated.  Current Meds  Medication Sig   amLODipine  (NORVASC ) 5 MG tablet Take 1 tablet (5 mg total) by mouth daily.   atorvastatin  (LIPITOR) 10 MG tablet TAKE ONE TABLET BY MOUTH EVERY DAY   hydrochlorothiazide  (HYDRODIURIL ) 25 MG tablet Take 1 tablet (25 mg total) by mouth every morning.   metoprolol  succinate (TOPROL -XL) 25 MG 24 hr tablet Take 1 tablet (25 mg total) by mouth daily.       08/04/2023    8:02 AM 08/01/2022    7:54 AM 07/27/2021    8:10 AM 07/21/2020   10:23 AM  GAD 7 : Generalized Anxiety Score  Nervous, Anxious, on Edge 3 1 1 2   Control/stop worrying 0 1 1 2   Worry too much - different things 0 1 1 3   Trouble relaxing 0 0 0 0  Restless 0 0 0 0  Easily annoyed or irritable 2 0 1 2  Afraid - awful might happen 1 0 0 0  Total GAD 7 Score 6 3 4 9   Anxiety Difficulty Not difficult at all Not difficult at all Not difficult at all Not difficult at all       08/04/2023    8:02 AM 08/01/2022    7:54 AM 07/27/2021    8:10 AM  Depression screen PHQ 2/9  Decreased Interest 0 0 0  Down, Depressed, Hopeless 0 0 0  PHQ - 2 Score 0 0 0  Altered sleeping 0 0 0  Tired, decreased energy 0 0 3  Change in appetite 0 0 0  Feeling bad or failure about yourself  0 0 0  Trouble concentrating 0 0 0  Moving slowly or fidgety/restless 0 0 0  Suicidal thoughts 0 0 0  PHQ-9 Score 0 0 3  Difficult doing work/chores Not difficult at all Not difficult at all     BP Readings from Last 3 Encounters:  08/04/23 138/82  08/01/22 122/76  07/27/21 128/70    Physical Exam Vitals and nursing note reviewed.   Constitutional:      Appearance: Normal appearance. He is well-developed.  HENT:     Head: Normocephalic.     Right Ear: Tympanic membrane, ear canal and external ear normal.     Left Ear: Tympanic membrane, ear canal and external ear normal.     Nose: Nose normal.  Eyes:     Conjunctiva/sclera: Conjunctivae normal.     Pupils: Pupils are equal, round,  and reactive to light.  Neck:     Thyroid: No thyromegaly.     Vascular: No carotid bruit.  Cardiovascular:     Rate and Rhythm: Normal rate and regular rhythm.     Heart sounds: Normal heart sounds.  Pulmonary:     Effort: Pulmonary effort is normal.     Breath sounds: Normal breath sounds. No wheezing.  Chest:  Breasts:    Right: No mass.     Left: No mass.  Abdominal:     General: Bowel sounds are normal.     Palpations: Abdomen is soft.     Tenderness: There is no abdominal tenderness.  Musculoskeletal:        General: Normal range of motion.     Cervical back: Normal range of motion and neck supple.     Right knee: Effusion present. Tenderness present.     Left knee: No effusion. No tenderness.     Right lower leg: No edema.     Left lower leg: No edema.  Lymphadenopathy:     Cervical: No cervical adenopathy.  Skin:    General: Skin is warm and dry.     Capillary Refill: Capillary refill takes less than 2 seconds.  Neurological:     General: No focal deficit present.     Mental Status: He is alert and oriented to person, place, and time.     Deep Tendon Reflexes: Reflexes are normal and symmetric.  Psychiatric:        Attention and Perception: Attention normal.        Mood and Affect: Mood normal.        Thought Content: Thought content normal.     Wt Readings from Last 3 Encounters:  08/04/23 236 lb 4 oz (107.2 kg)  08/01/22 222 lb (100.7 kg)  07/27/21 236 lb (107 kg)    BP 138/82   Pulse 87   Ht 5\' 11"  (1.803 m)   Wt 236 lb 4 oz (107.2 kg)   SpO2 98%   BMI 32.95 kg/m   Assessment and  Plan:  Problem List Items Addressed This Visit       Unprioritized   Essential (primary) hypertension (Chronic)   Blood pressure is well controlled.  Current medications are amlodipine , metoprolol  and hydrochlorothiazide . Will continue same regimen along with efforts to limit dietary sodium.       Relevant Orders   CBC with Differential/Platelet   Comprehensive metabolic panel with GFR   Stable angina pectoris (HCC) (Chronic)   No new symptoms of chest pain or shortness of breath/DOE. Will continue metoprolol  and statin. Consider Coronary CT      Mixed hyperlipidemia   LDL is  Lab Results  Component Value Date   LDLCALC 77 08/01/2022   Current regimen is atorvastatin .  No medication side effects noted. Goal LDL is <55.       Relevant Orders   Lipid panel   Family hx of colon cancer   Family history of premature CAD   Other Visit Diagnoses       Annual physical exam    -  Primary   Relevant Orders   CBC with Differential/Platelet   Comprehensive metabolic panel with GFR   Lipid panel   PSA   Fecal occult blood, imunochemical     Colon cancer screening       Relevant Orders   Fecal occult blood, imunochemical     Prostate cancer screening       Relevant  Orders   PSA     Need for hepatitis C screening test         Chronic pain of right knee       continue bracing, tylenol and ice as needed see Ortho or SM if worsening       Return in about 1 year (around 08/03/2024) for CPX w Dr. Cari Char.    Sheron Dixons, MD [facility] Primary Care and Sports Medicine [place]

## 2023-08-04 NOTE — Assessment & Plan Note (Addendum)
 No new symptoms of chest pain or shortness of breath/DOE. Will continue metoprolol  and statin. Consider Coronary CT

## 2023-08-04 NOTE — Assessment & Plan Note (Signed)
 Blood pressure is well controlled.  Current medications are amlodipine , metoprolol  and hydrochlorothiazide . Will continue same regimen along with efforts to limit dietary sodium.

## 2023-09-21 ENCOUNTER — Other Ambulatory Visit: Payer: Self-pay | Admitting: Internal Medicine

## 2023-09-21 DIAGNOSIS — I10 Essential (primary) hypertension: Secondary | ICD-10-CM

## 2023-09-22 ENCOUNTER — Other Ambulatory Visit: Payer: Self-pay | Admitting: Internal Medicine

## 2023-09-22 DIAGNOSIS — E782 Mixed hyperlipidemia: Secondary | ICD-10-CM

## 2023-09-23 NOTE — Telephone Encounter (Signed)
 Requested medication (s) are due for refill today: yes  Requested medication (s) are on the active medication list: yes  Last refill:  06/26/23 #90   Future visit scheduled: no  Notes to clinic:  overdue lab work    Requested Prescriptions  Pending Prescriptions Disp Refills   atorvastatin  (LIPITOR) 10 MG tablet [Pharmacy Med Name: ATORVASTATIN  CALCIUM  10MG  TABS] 90 tablet 0    Sig: TAKE ONE TABLET BY MOUTH EVERY DAY     Cardiovascular:  Antilipid - Statins Failed - 09/23/2023  3:27 PM      Failed - Lipid Panel in normal range within the last 12 months    Cholesterol, Total  Date Value Ref Range Status  07/27/2021 194 100 - 199 mg/dL Final   Cholesterol  Date Value Ref Range Status  08/01/2022 147 0 - 200 mg/dL Final   LDL Chol Calc (NIH)  Date Value Ref Range Status  07/27/2021 134 (H) 0 - 99 mg/dL Final   LDL Cholesterol  Date Value Ref Range Status  08/01/2022 77 0 - 99 mg/dL Final    Comment:           Total Cholesterol/HDL:CHD Risk Coronary Heart Disease Risk Table                     Men   Women  1/2 Average Risk   3.4   3.3  Average Risk       5.0   4.4  2 X Average Risk   9.6   7.1  3 X Average Risk  23.4   11.0        Use the calculated Patient Ratio above and the CHD Risk Table to determine the patient's CHD Risk.        ATP III CLASSIFICATION (LDL):  <100     mg/dL   Optimal  899-870  mg/dL   Near or Above                    Optimal  130-159  mg/dL   Borderline  839-810  mg/dL   High  >809     mg/dL   Very High Performed at [facility], [address] [place], [place] [address]    HDL  Date Value Ref Range Status  08/01/2022 50 >40 mg/dL Final  94/83/7976 44 >60 mg/dL Final   Triglycerides  Date Value Ref Range Status  08/01/2022 101 <150 mg/dL Final         Passed - Patient is not pregnant      Passed - Valid encounter within last 12 months    Recent Outpatient Visits           1 month ago Annual physical exam   [facility]  Health Primary Care & Sports Medicine at [facility], Leita DEL, MD

## 2023-09-23 NOTE — Telephone Encounter (Signed)
 Requested medication (s) are due for refill today: yes  Requested medication (s) are on the active medication list: yes  Last refill:  08/01/22 #90 3 RF  Future visit scheduled: no  Notes to clinic:  overdue lab work    Requested Prescriptions  Pending Prescriptions Disp Refills   hydrochlorothiazide  (HYDRODIURIL ) 25 MG tablet [Pharmacy Med Name: HYDROCHLOROTHIAZIDE  25MG  TABS] 90 tablet     Sig: TAKE ONE TABLET BY MOUTH EVERY MORNING     Cardiovascular: Diuretics - Thiazide Failed - 09/23/2023 11:35 AM      Failed - Cr in normal range and within 180 days    Creatinine, Ser  Date Value Ref Range Status  08/01/2022 0.87 0.61 - 1.24 mg/dL Final         Failed - K in normal range and within 180 days    Potassium  Date Value Ref Range Status  08/01/2022 3.5 3.5 - 5.1 mmol/L Final         Failed - Na in normal range and within 180 days    Sodium  Date Value Ref Range Status  08/01/2022 137 135 - 145 mmol/L Final  07/27/2021 141 134 - 144 mmol/L Final         Passed - Last BP in normal range    BP Readings from Last 1 Encounters:  08/04/23 138/82         Passed - Valid encounter within last 6 months    Recent Outpatient Visits           1 month ago Annual physical exam   [place] [facility] at [facility], Leita DEL, MD              Signed Prescriptions Disp Refills   amLODipine  (NORVASC ) 5 MG tablet 90 tablet 1    Sig: TAKE ONE TABLET BY MOUTH EVERY DAY     Cardiovascular: Calcium  Channel Blockers 2 Passed - 09/23/2023 11:35 AM      Passed - Last BP in normal range    BP Readings from Last 1 Encounters:  08/04/23 138/82         Passed - Last Heart Rate in normal range    Pulse Readings from Last 1 Encounters:  08/04/23 87         Passed - Valid encounter within last 6 months    Recent Outpatient Visits           1 month ago Annual physical exam   [facility] Primary Care & Sports Medicine at [facility], Leita DEL, MD

## 2023-09-23 NOTE — Telephone Encounter (Signed)
 Requested Prescriptions  Pending Prescriptions Disp Refills   amLODipine  (NORVASC ) 5 MG tablet [Pharmacy Med Name: AMLODIPINE  BESYLATE 5MG  TABS] 90 tablet 1    Sig: TAKE ONE TABLET BY MOUTH EVERY DAY     Cardiovascular: Calcium  Channel Blockers 2 Passed - 09/23/2023 11:33 AM      Passed - Last BP in normal range    BP Readings from Last 1 Encounters:  08/04/23 138/82         Passed - Last Heart Rate in normal range    Pulse Readings from Last 1 Encounters:  08/04/23 87         Passed - Valid encounter within last 6 months    Recent Outpatient Visits           1 month ago Annual physical exam   [place] [facility] Medicine at [facility], Leita DEL, MD               hydrochlorothiazide  (HYDRODIURIL ) 25 MG tablet [Pharmacy Med Name: HYDROCHLOROTHIAZIDE  25MG  TABS] 90 tablet     Sig: TAKE ONE TABLET BY MOUTH EVERY MORNING     Cardiovascular: Diuretics - Thiazide Failed - 09/23/2023 11:33 AM      Failed - Cr in normal range and within 180 days    Creatinine, Ser  Date Value Ref Range Status  08/01/2022 0.87 0.61 - 1.24 mg/dL Final         Failed - K in normal range and within 180 days    Potassium  Date Value Ref Range Status  08/01/2022 3.5 3.5 - 5.1 mmol/L Final         Failed - Na in normal range and within 180 days    Sodium  Date Value Ref Range Status  08/01/2022 137 135 - 145 mmol/L Final  07/27/2021 141 134 - 144 mmol/L Final         Passed - Last BP in normal range    BP Readings from Last 1 Encounters:  08/04/23 138/82         Passed - Valid encounter within last 6 months    Recent Outpatient Visits           1 month ago Annual physical exam   [facility] Primary Care & Sports Medicine at [facility], Leita DEL, MD

## 2023-11-07 ENCOUNTER — Other Ambulatory Visit: Payer: Self-pay | Admitting: Internal Medicine

## 2023-11-07 DIAGNOSIS — I10 Essential (primary) hypertension: Secondary | ICD-10-CM

## 2023-11-09 NOTE — Telephone Encounter (Signed)
 Requested medication (s) are due for refill today: expired medication  Requested medication (s) are on the active medication list: yes   Last refill:  08/01/22-08/04/23 # 90 3 refills  Future visit scheduled: no   Notes to clinic:  expired medication date. Do you want to renew Rx?     Requested Prescriptions  Pending Prescriptions Disp Refills   metoprolol  succinate (TOPROL -XL) 25 MG 24 hr tablet [Pharmacy Med Name: METOPROLOL  SUCCINATE ER 25MG  TB24] 90 tablet 3    Sig: TAKE ONE TABLET BY MOUTH EVERY DAY     Cardiovascular:  Beta Blockers Passed - 11/09/2023  2:06 PM      Passed - Last BP in normal range    BP Readings from Last 1 Encounters:  08/04/23 138/82         Passed - Last Heart Rate in normal range    Pulse Readings from Last 1 Encounters:  08/04/23 87         Passed - Valid encounter within last 6 months    Recent Outpatient Visits           3 months ago Annual physical exam   [facility] Primary Care & Sports Medicine at [facility], Leita DEL, MD

## 2024-02-18 ENCOUNTER — Other Ambulatory Visit: Payer: Self-pay | Admitting: Internal Medicine

## 2024-02-18 DIAGNOSIS — I10 Essential (primary) hypertension: Secondary | ICD-10-CM

## 2024-02-20 NOTE — Telephone Encounter (Signed)
 Requested Prescriptions  Pending Prescriptions Disp Refills   metoprolol  succinate (TOPROL -XL) 25 MG 24 hr tablet [Pharmacy Med Name: METOPROLOL  SUCCINATE ER 25MG  TB24] 90 tablet 0    Sig: TAKE ONE TABLET BY MOUTH EVERY DAY     Cardiovascular:  Beta Blockers Failed - 02/20/2024  2:04 PM      Failed - Valid encounter within last 6 months    Recent Outpatient Visits           6 months ago Annual physical exam   [place] [facility] at [facility], Leita DEL, MD              Passed - Last BP in normal range    BP Readings from Last 1 Encounters:  08/04/23 138/82         Passed - Last Heart Rate in normal range    Pulse Readings from Last 1 Encounters:  08/04/23 87

## 2024-03-19 ENCOUNTER — Other Ambulatory Visit: Payer: Self-pay | Admitting: Internal Medicine

## 2024-03-19 DIAGNOSIS — E782 Mixed hyperlipidemia: Secondary | ICD-10-CM

## 2024-03-19 DIAGNOSIS — I10 Essential (primary) hypertension: Secondary | ICD-10-CM

## 2024-03-20 NOTE — Telephone Encounter (Signed)
 Requested medication (s) are due for refill today: Yes  Requested medication (s) are on the active medication list: Yes  Last refill:  09/24/23, 09/23/23  Future visit scheduled: No  Notes to clinic:  Unable to refill per protocol due to failed labs, no updated results.      Requested Prescriptions  Pending Prescriptions Disp Refills   amLODipine  (NORVASC ) 5 MG tablet [Pharmacy Med Name: AMLODIPINE  BESYLATE 5MG  TABS] 90 tablet 1    Sig: TAKE ONE TABLET BY MOUTH EVERY DAY     Cardiovascular: Calcium  Channel Blockers 2 Failed - 03/20/2024  4:49 PM      Failed - Valid encounter within last 6 months    Recent Outpatient Visits           7 months ago Annual physical exam   [place] [facility] Medicine at [facility], Leita DEL, MD              Passed - Last BP in normal range    BP Readings from Last 1 Encounters:  08/04/23 138/82         Passed - Last Heart Rate in normal range    Pulse Readings from Last 1 Encounters:  08/04/23 87          hydrochlorothiazide  (HYDRODIURIL ) 25 MG tablet [Pharmacy Med Name: HYDROCHLOROTHIAZIDE  25MG  TABS] 90 tablet 1    Sig: TAKE ONE TABLET BY MOUTH EVERY MORNING     Cardiovascular: Diuretics - Thiazide Failed - 03/20/2024  4:49 PM      Failed - Cr in normal range and within 180 days    Creatinine, Ser  Date Value Ref Range Status  08/01/2022 0.87 0.61 - 1.24 mg/dL Final         Failed - K in normal range and within 180 days    Potassium  Date Value Ref Range Status  08/01/2022 3.5 3.5 - 5.1 mmol/L Final         Failed - Na in normal range and within 180 days    Sodium  Date Value Ref Range Status  08/01/2022 137 135 - 145 mmol/L Final  07/27/2021 141 134 - 144 mmol/L Final         Failed - Valid encounter within last 6 months    Recent Outpatient Visits           7 months ago Annual physical exam    Primary Care & Sports Medicine at [facility], Leita DEL, MD               Passed - Last BP in normal range    BP Readings from Last 1 Encounters:  08/04/23 138/82          atorvastatin  (LIPITOR) 10 MG tablet [Pharmacy Med Name: ATORVASTATIN  CALCIUM  10MG  TABS] 90 tablet 1    Sig: TAKE ONE TABLET BY MOUTH EVERY DAY     Cardiovascular:  Antilipid - Statins Failed - 03/20/2024  4:49 PM      Failed - Lipid Panel in normal range within the last 12 months    Cholesterol, Total  Date Value Ref Range Status  07/27/2021 194 100 - 199 mg/dL Final   Cholesterol  Date Value Ref Range Status  08/01/2022 147 0 - 200 mg/dL Final   LDL Chol Calc (NIH)  Date Value Ref Range Status  07/27/2021 134 (H) 0 - 99 mg/dL Final   LDL Cholesterol  Date Value Ref Range Status  08/01/2022 77  0 - 99 mg/dL Final    Comment:           Total Cholesterol/HDL:CHD Risk Coronary Heart Disease Risk Table                     Men   Women  1/2 Average Risk   3.4   3.3  Average Risk       5.0   4.4  2 X Average Risk   9.6   7.1  3 X Average Risk  23.4   11.0        Use the calculated Patient Ratio above and the CHD Risk Table to determine the patient's CHD Risk.        ATP III CLASSIFICATION (LDL):  <100     mg/dL   Optimal  899-870  mg/dL   Near or Above                    Optimal  130-159  mg/dL   Borderline  839-810  mg/dL   High  >809     mg/dL   Very High Performed at [facility], [address] [place], [place] [address]    HDL  Date Value Ref Range Status  08/01/2022 50 >40 mg/dL Final  94/83/7976 44 >60 mg/dL Final   Triglycerides  Date Value Ref Range Status  08/01/2022 101 <150 mg/dL Final         Passed - Patient is not pregnant      Passed - Valid encounter within last 12 months    Recent Outpatient Visits           7 months ago Annual physical exam   [facility] Primary Care & Sports Medicine at [facility], Leita DEL, MD

## 2024-03-21 NOTE — Telephone Encounter (Signed)
 Please call pt to schedule an appt.  KP

## 2024-03-21 NOTE — Telephone Encounter (Signed)
 Left message to mychart.

## 2024-04-12 ENCOUNTER — Other Ambulatory Visit: Payer: Self-pay | Admitting: Student

## 2024-04-12 DIAGNOSIS — I10 Essential (primary) hypertension: Secondary | ICD-10-CM

## 2024-04-12 DIAGNOSIS — E782 Mixed hyperlipidemia: Secondary | ICD-10-CM

## 2024-04-12 NOTE — Telephone Encounter (Unsigned)
 Copied from CRM [id number]. Topic: Clinical - Medication Refill >> Apr 12, 2024  2:34 PM Wess RAMAN wrote: Medication: amLODipine  (NORVASC ) 5 MG tablet  atorvastatin  (LIPITOR) 10 MG tablet  hydrochlorothiazide  (HYDRODIURIL ) 25 MG tablet  metoprolol  succinate (TOPROL -XL) 25 MG 24 hr tablet    Has the patient contacted their pharmacy? No (Agent: If no, request that the patient contact the pharmacy for the refill. If patient does not wish to contact the pharmacy document the reason why and proceed with request.) (Agent: If yes, when and what did the pharmacy advise?)  This is the patient's preferred pharmacy:  [id number] - [place], [place] - [address] [place] [address] Phone: [phone] Fax: [phone]  Is this the correct pharmacy for this prescription? Yes If no, delete pharmacy and type the correct one.   Has the prescription been filled recently? Yes  Is the patient out of the medication? No  Has the patient been seen for an appointment in the last year OR does the patient have an upcoming appointment? Yes  Can we respond through MyChart? Yes  Agent: Please be advised that Rx refills may take up to 3 business days. We ask that you follow-up with your pharmacy.

## 2024-04-15 MED ORDER — METOPROLOL SUCCINATE ER 25 MG PO TB24: 25.0000 mg | ORAL_TABLET | Freq: Every day | ORAL | 0 refills | Status: AC

## 2024-04-15 MED ORDER — ATORVASTATIN CALCIUM 10 MG PO TABS: 10.0000 mg | ORAL_TABLET | Freq: Every day | ORAL | 0 refills | Status: AC

## 2024-04-15 MED ORDER — HYDROCHLOROTHIAZIDE 25 MG PO TABS: 25.0000 mg | ORAL_TABLET | Freq: Every morning | ORAL | 0 refills | Status: AC

## 2024-04-15 MED ORDER — AMLODIPINE BESYLATE 5 MG PO TABS: 5.0000 mg | ORAL_TABLET | Freq: Every day | ORAL | 0 refills | Status: AC

## 2024-06-27 ENCOUNTER — Encounter: Payer: Self-pay | Admitting: Student
